# Patient Record
Sex: Male | Born: 1945
Health system: Southern US, Community
[De-identification: ages and names within clinical notes are randomized; demographics above are authoritative.]

## PROBLEM LIST (undated history)

## (undated) DIAGNOSIS — Z860100 Personal history of colon polyps, unspecified: Secondary | ICD-10-CM

## (undated) DIAGNOSIS — H9190 Unspecified hearing loss, unspecified ear: Secondary | ICD-10-CM

## (undated) DIAGNOSIS — Z8679 Personal history of other diseases of the circulatory system: Secondary | ICD-10-CM

## (undated) DIAGNOSIS — K5790 Diverticulosis of intestine, part unspecified, without perforation or abscess without bleeding: Secondary | ICD-10-CM

## (undated) DIAGNOSIS — G4730 Sleep apnea, unspecified: Secondary | ICD-10-CM

## (undated) DIAGNOSIS — I10 Essential (primary) hypertension: Secondary | ICD-10-CM

## (undated) DIAGNOSIS — T8859XA Other complications of anesthesia, initial encounter: Secondary | ICD-10-CM

## (undated) DIAGNOSIS — T4145XA Adverse effect of unspecified anesthetic, initial encounter: Secondary | ICD-10-CM

## (undated) DIAGNOSIS — R9431 Abnormal electrocardiogram [ECG] [EKG]: Secondary | ICD-10-CM

## (undated) DIAGNOSIS — M1990 Unspecified osteoarthritis, unspecified site: Secondary | ICD-10-CM

## (undated) DIAGNOSIS — M1712 Unilateral primary osteoarthritis, left knee: Secondary | ICD-10-CM

## (undated) DIAGNOSIS — E785 Hyperlipidemia, unspecified: Secondary | ICD-10-CM

## (undated) DIAGNOSIS — C4491 Basal cell carcinoma of skin, unspecified: Secondary | ICD-10-CM

## (undated) HISTORY — DX: Essential (primary) hypertension: I10

## (undated) HISTORY — PX: MOUTH SURGERY: SHX715

## (undated) HISTORY — PX: COLONOSCOPY W/ POLYPECTOMY: SHX1380

## (undated) HISTORY — DX: Unilateral primary osteoarthritis, left knee: M17.12

## (undated) HISTORY — DX: Unspecified hearing loss, unspecified ear: H91.90

## (undated) HISTORY — DX: Hyperlipidemia, unspecified: E78.5

---
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
## 2002-09-28 ENCOUNTER — Encounter: Admission: RE | Admit: 2002-09-28 | Discharge: 2002-12-27 | Payer: Self-pay | Admitting: Family Medicine

## 2006-06-23 ENCOUNTER — Encounter: Admission: RE | Admit: 2006-06-23 | Discharge: 2006-06-23 | Payer: Self-pay | Admitting: Family Medicine

## 2010-06-08 ENCOUNTER — Ambulatory Visit: Payer: Self-pay | Admitting: Internal Medicine

## 2013-11-04 HISTORY — PX: ROTATOR CUFF REPAIR: SHX139

## 2013-12-23 ENCOUNTER — Encounter (INDEPENDENT_AMBULATORY_CARE_PROVIDER_SITE_OTHER): Payer: Self-pay | Admitting: Surgery

## 2013-12-31 ENCOUNTER — Ambulatory Visit (INDEPENDENT_AMBULATORY_CARE_PROVIDER_SITE_OTHER): Payer: BC Managed Care – PPO | Admitting: Surgery

## 2014-01-05 ENCOUNTER — Encounter (INDEPENDENT_AMBULATORY_CARE_PROVIDER_SITE_OTHER): Payer: Self-pay | Admitting: Surgery

## 2014-01-10 ENCOUNTER — Encounter (HOSPITAL_COMMUNITY): Payer: Self-pay | Admitting: Pharmacy Technician

## 2014-01-12 ENCOUNTER — Encounter: Payer: Self-pay | Admitting: Physician Assistant

## 2014-01-12 ENCOUNTER — Other Ambulatory Visit: Payer: Self-pay | Admitting: Physician Assistant

## 2014-01-12 DIAGNOSIS — E785 Hyperlipidemia, unspecified: Secondary | ICD-10-CM | POA: Insufficient documentation

## 2014-01-12 DIAGNOSIS — M1712 Unilateral primary osteoarthritis, left knee: Secondary | ICD-10-CM

## 2014-01-12 DIAGNOSIS — H9190 Unspecified hearing loss, unspecified ear: Secondary | ICD-10-CM

## 2014-01-12 DIAGNOSIS — I10 Essential (primary) hypertension: Secondary | ICD-10-CM | POA: Insufficient documentation

## 2014-01-12 NOTE — H&P (Signed)
TOTAL KNEE ADMISSION H&P  Patient is being admitted for left total knee arthroplasty.  Subjective:  Chief Complaint:left knee pain.  HPI: Steven Austin, 68 y.o. male, has a history of pain and functional disability in the left knee due to arthritis and has failed non-surgical conservative treatments for greater than 12 weeks to includeNSAID's and/or analgesics, corticosteriod injections, viscosupplementation injections, flexibility and strengthening excercises, supervised PT with diminished ADL's post treatment, use of assistive devices and activity modification.  Onset of symptoms was gradual, starting 10 years ago with gradually worsening course since that time. The patient noted no past surgery on the left knee(s).  Patient currently rates pain in the left knee(s) at 10 out of 10 with activity. Patient has night pain, worsening of pain with activity and weight bearing, pain that interferes with activities of daily living, crepitus and joint swelling.  Patient has evidence of subchondral sclerosis, periarticular osteophytes and joint space narrowing by imaging studies. There is no active infection.  Patient Active Problem List   Diagnosis Date Noted  . Hypertension   . Hearing loss   . Hyperlipidemia   . Left knee DJD    Past Medical History  Diagnosis Date  . Hypertension   . Hearing loss   . Hyperlipidemia   . Left knee DJD     Past Surgical History  Procedure Laterality Date  . Colon surgery    . Leg surgery    . Colonoscopy       (Not in a hospital admission) Allergies  Allergen Reactions  . Augmentin [Amoxicillin-Pot Clavulanate] Rash     Current Outpatient Prescriptions on File Prior to Visit  Medication Sig Dispense Refill  . aspirin 81 MG tablet Take 81 mg by mouth daily.      Marland Kitchen atorvastatin (LIPITOR) 20 MG tablet Take 20 mg by mouth daily.      . beta carotene w/minerals (OCUVITE) tablet Take 1 tablet by mouth daily.      . fenofibrate 160 MG tablet Take 160 mg  by mouth daily.      Marland Kitchen glucosamine-chondroitin 500-400 MG tablet Take 1 tablet by mouth 3 (three) times daily.      . hydrochlorothiazide (HYDRODIURIL) 25 MG tablet Take 25 mg by mouth daily.      Marland Kitchen NIFEdipine (PROCARDIA-XL/ADALAT CC) 60 MG 24 hr tablet Take 60 mg by mouth daily.       No current facility-administered medications on file prior to visit.   History  Substance Use Topics  . Smoking status: Former Smoker    Quit date: 01/13/1972  . Smokeless tobacco: Not on file  . Alcohol Use: Yes     Comment: 2-3 drinks a day    Family History  Problem Relation Age of Onset  . Cancer Mother   . Lung disease Father   . Breast cancer Sister      Review of Systems  Constitutional: Negative.   HENT: Negative.   Eyes: Negative.   Respiratory: Negative.   Cardiovascular: Negative.   Genitourinary: Negative.   Musculoskeletal: Positive for joint pain.  Skin: Negative.   Neurological: Negative.   Endo/Heme/Allergies: Negative.   Psychiatric/Behavioral: Negative.     Objective:  Physical Exam  Constitutional: He is oriented to person, place, and time. He appears well-developed and well-nourished.  HENT:  Head: Normocephalic and atraumatic.  Mouth/Throat: Oropharynx is clear and moist.  Eyes: Conjunctivae and EOM are normal. Pupils are equal, round, and reactive to light.  Neck: Normal range of motion.  Cardiovascular: Normal rate and normal heart sounds.   Occasional extra beat  Respiratory: Effort normal and breath sounds normal.  GI: Soft. Bowel sounds are normal.  Genitourinary:  Not pertinent to current symptomatology therefore not examined.  Musculoskeletal:  Examination of his left knee reveals pain medially and laterally 1+ crepitation 1+ synovitis range of motion from -5 to 125 degrees knee is stable with normal patella tracking. Exam of the right knee reveals 1+ crepitation 1+ synovitis full range of motion knee is stable with normal patella tracking. Vascular exam:  pulses 2+ and symmetric.  Neurological: He is alert and oriented to person, place, and time.  Skin: Skin is warm and dry.  Psychiatric: He has a normal mood and affect. His behavior is normal.    Vital signs in last 24 hours: Last recorded: 03/11 1500   BP: 142/87 Pulse: 80  Temp: 98.1 F (36.7 C)    Height: 5\' 8"  (1.727 m) SpO2: 98  Weight: 89.812 kg (198 lb)     Labs:   Estimated body mass index is 30.11 kg/(m^2) as calculated from the following:   Height as of this encounter: 5\' 8"  (1.727 m).   Weight as of this encounter: 89.812 kg (198 lb).   Imaging Review X-RAYS: X-rays are reviewed from 10/14 that show medial compartment degenerative joint disease with bone on bone and mild tibiofemoral subluxation. Plain radiographs demonstrate severe degenerative joint disease of the left knee(s). The overall alignment issignificant varus. The bone quality appears to be good for age and reported activity level.  Assessment/Plan:  End stage arthritis, left knee Patient Active Problem List   Diagnosis Date Noted  . Hypertension   . Hearing loss   . Hyperlipidemia   . Left knee DJD      The patient history, physical examination, clinical judgment of the provider and imaging studies are consistent with end stage degenerative joint disease of the left knee(s) and total knee arthroplasty is deemed medically necessary. The treatment options including medical management, injection therapy arthroscopy and arthroplasty were discussed at length. The risks and benefits of total knee arthroplasty were presented and reviewed. The risks due to aseptic loosening, infection, stiffness, patella tracking problems, thromboembolic complications and other imponderables were discussed. The patient acknowledged the explanation, agreed to proceed with the plan and consent was signed. Patient is being admitted for inpatient treatment for surgery, pain control, PT, OT, prophylactic antibiotics, VTE  prophylaxis, progressive ambulation and ADL's and discharge planning. The patient is planning to be discharged home with home health services  Brantlee Hinde A. Kaleen Mask Physician Assistant [facility] [phone]  01/12/2014, 3:51 PM

## 2014-01-14 NOTE — Pre-Procedure Instructions (Signed)
Ponciano Shealy  01/14/2014   Your procedure is scheduled on:  Mon, Mar 23 @ 9:00 AM  Report to [facility] at 7:00 AM.  Call this number if you have problems the morning of surgery: [phone]   Remember:   Do not eat food or drink liquids after midnight.   Take these medicines the morning of surgery with A SIP OF WATER: Procardia(Nifedipine)              Stop taking your Aspirin. No Goody's,BC's,Aleve,Ibuprofen,Fish Oil,or any Herbal Medications   Do not wear jewelry  Do not wear lotions, powders, or colognes. You may wear deodorant.  Men may shave face and neck.  Do not bring valuables to the hospital.  [facility] is not responsible                  for any belongings or valuables.               Contacts, dentures or bridgework may not be worn into surgery.  Leave suitcase in the car. After surgery it may be brought to your room.  For patients admitted to the hospital, discharge time is determined by your                treatment team.               Special Instructions:  [place] - Preparing for Surgery  Before surgery, you can play an important role.  Because skin is not sterile, your skin needs to be as free of germs as possible.  You can reduce the number of germs on you skin by washing with CHG (chlorahexidine gluconate) soap before surgery.  CHG is an antiseptic cleaner which kills germs and bonds with the skin to continue killing germs even after washing.  Please DO NOT use if you have an allergy to CHG or antibacterial soaps.  If your skin becomes reddened/irritated stop using the CHG and inform your nurse when you arrive at Short Stay.  Do not shave (including legs and underarms) for at least 48 hours prior to the first CHG shower.  You may shave your face.  Please follow these instructions carefully:   1.  Shower with CHG Soap the night before surgery and the                                morning of Surgery.  2.  If you choose to wash your hair, wash  your hair first as usual with your       normal shampoo.  3.  After you shampoo, rinse your hair and body thoroughly to remove the                      Shampoo.  4.  Use CHG as you would any other liquid soap.  You can apply chg directly       to the skin and wash gently with scrungie or a clean washcloth.  5.  Apply the CHG Soap to your body ONLY FROM THE NECK DOWN.        Do not use on open wounds or open sores.  Avoid contact with your eyes,       ears, mouth and genitals (private parts).  Wash genitals (private parts)       with your normal soap.  6.  Wash thoroughly, paying special attention to  the area where your surgery        will be performed.  7.  Thoroughly rinse your body with warm water from the neck down.  8.  DO NOT shower/wash with your normal soap after using and rinsing off       the CHG Soap.  9.  Pat yourself dry with a clean towel.            10.  Wear clean pajamas.            11.  Place clean sheets on your bed the night of your first shower and do not        sleep with pets.  Day of Surgery  Do not apply any lotions/deoderants the morning of surgery.  Please wear clean clothes to the hospital/surgery center.     Please read over the following fact sheets that you were given: Pain Booklet, Coughing and Deep Breathing, Blood Transfusion Information, MRSA Information and Surgical Site Infection Prevention

## 2014-01-17 ENCOUNTER — Encounter (INDEPENDENT_AMBULATORY_CARE_PROVIDER_SITE_OTHER): Payer: Self-pay

## 2014-01-17 ENCOUNTER — Encounter (HOSPITAL_COMMUNITY): Payer: Self-pay

## 2014-01-17 ENCOUNTER — Encounter (HOSPITAL_COMMUNITY)
Admission: RE | Admit: 2014-01-17 | Discharge: 2014-01-17 | Disposition: A | Discharge status: Home / Self Care | Payer: BC Managed Care – PPO | Source: Ambulatory Visit | Attending: Orthopedic Surgery | Admitting: Orthopedic Surgery

## 2014-01-17 ENCOUNTER — Encounter (HOSPITAL_COMMUNITY)
Admission: RE | Admit: 2014-01-17 | Discharge: 2014-01-17 | Disposition: A | Discharge status: Home / Self Care | Payer: BC Managed Care – PPO | Source: Ambulatory Visit | Attending: Physician Assistant | Admitting: Physician Assistant

## 2014-01-17 ENCOUNTER — Inpatient Hospital Stay (HOSPITAL_COMMUNITY): Admission: RE | Admit: 2014-01-17 | Payer: Self-pay | Source: Ambulatory Visit

## 2014-01-17 DIAGNOSIS — Z01810 Encounter for preprocedural cardiovascular examination: Secondary | ICD-10-CM | POA: Insufficient documentation

## 2014-01-17 DIAGNOSIS — I10 Essential (primary) hypertension: Secondary | ICD-10-CM | POA: Insufficient documentation

## 2014-01-17 DIAGNOSIS — E785 Hyperlipidemia, unspecified: Secondary | ICD-10-CM | POA: Insufficient documentation

## 2014-01-17 DIAGNOSIS — Z01812 Encounter for preprocedural laboratory examination: Secondary | ICD-10-CM | POA: Insufficient documentation

## 2014-01-17 DIAGNOSIS — Z87891 Personal history of nicotine dependence: Secondary | ICD-10-CM | POA: Insufficient documentation

## 2014-01-17 DIAGNOSIS — H918X9 Other specified hearing loss, unspecified ear: Secondary | ICD-10-CM | POA: Insufficient documentation

## 2014-01-17 DIAGNOSIS — M1710 Unilateral primary osteoarthritis, unspecified knee: Secondary | ICD-10-CM | POA: Insufficient documentation

## 2014-01-17 DIAGNOSIS — R7301 Impaired fasting glucose: Secondary | ICD-10-CM | POA: Insufficient documentation

## 2014-01-17 LAB — CBC WITH DIFFERENTIAL/PLATELET
BASOS ABS: 0.1 10*3/uL (ref 0.0–0.1)
BASOS PCT: 1 % (ref 0–1)
EOS ABS: 0.1 10*3/uL (ref 0.0–0.7)
Eosinophils Relative: 1 % (ref 0–5)
HCT: 46.3 % (ref 39.0–52.0)
Hemoglobin: 16.4 g/dL (ref 13.0–17.0)
Lymphocytes Relative: 22 % (ref 12–46)
Lymphs Abs: 1.6 10*3/uL (ref 0.7–4.0)
MCH: 32.1 pg (ref 26.0–34.0)
MCHC: 35.4 g/dL (ref 30.0–36.0)
MCV: 90.6 fL (ref 78.0–100.0)
Monocytes Absolute: 0.7 10*3/uL (ref 0.1–1.0)
Monocytes Relative: 9 % (ref 3–12)
NEUTROS ABS: 4.7 10*3/uL (ref 1.7–7.7)
NEUTROS PCT: 67 % (ref 43–77)
PLATELETS: 224 10*3/uL (ref 150–400)
RBC: 5.11 MIL/uL (ref 4.22–5.81)
RDW: 12.9 % (ref 11.5–15.5)
WBC: 7.1 10*3/uL (ref 4.0–10.5)

## 2014-01-17 LAB — COMPREHENSIVE METABOLIC PANEL
ALBUMIN: 4.5 g/dL (ref 3.5–5.2)
ALT: 23 U/L (ref 0–53)
AST: 21 U/L (ref 0–37)
Alkaline Phosphatase: 76 U/L (ref 39–117)
BUN: 27 mg/dL — AB (ref 6–23)
CHLORIDE: 99 meq/L (ref 96–112)
CO2: 25 mEq/L (ref 19–32)
Calcium: 10.9 mg/dL — ABNORMAL HIGH (ref 8.4–10.5)
Creatinine, Ser: 0.97 mg/dL (ref 0.50–1.35)
GFR calc Af Amer: >90 mL/min (ref >90)
GFR calc non Af Amer: 84 mL/min — ABNORMAL LOW (ref >90)
Glucose, Bld: 143 mg/dL — ABNORMAL HIGH (ref 70–99)
POTASSIUM: 3.6 meq/L — AB (ref 3.7–5.3)
SODIUM: 141 meq/L (ref 137–147)
TOTAL PROTEIN: 7.7 g/dL (ref 6.0–8.3)
Total Bilirubin: 0.8 mg/dL (ref 0.3–1.2)

## 2014-01-17 LAB — PROTIME-INR
INR: 0.93 (ref 0.00–1.49)
PROTHROMBIN TIME: 12.3 s (ref 11.6–15.2)

## 2014-01-17 LAB — URINALYSIS, ROUTINE W REFLEX MICROSCOPIC
BILIRUBIN URINE: NEGATIVE
Glucose, UA: NEGATIVE mg/dL
Hgb urine dipstick: NEGATIVE
KETONES UR: NEGATIVE mg/dL
LEUKOCYTES UA: NEGATIVE
NITRITE: NEGATIVE
Protein, ur: NEGATIVE mg/dL
SPECIFIC GRAVITY, URINE: 1.028 (ref 1.005–1.030)
UROBILINOGEN UA: 0.2 mg/dL (ref 0.0–1.0)
pH: 5.5 (ref 5.0–8.0)

## 2014-01-17 LAB — ABO/RH: ABO/RH(D): O NEG

## 2014-01-17 LAB — TYPE AND SCREEN
ABO/RH(D): O NEG
ANTIBODY SCREEN: NEGATIVE

## 2014-01-17 LAB — SURGICAL PCR SCREEN
MRSA, PCR: NEGATIVE
Staphylococcus aureus: NEGATIVE

## 2014-01-17 LAB — APTT: aPTT: 26 seconds (ref 24–37)

## 2014-01-17 NOTE — Progress Notes (Signed)
01/17/14 0819  OBSTRUCTIVE SLEEP APNEA  Have you ever been diagnosed with sleep apnea through a sleep study? No  Do you snore loudly (loud enough to be heard through closed doors)?  1  Do you often feel tired, fatigued, or sleepy during the daytime? 0  Has anyone observed you stop breathing during your sleep? 0  Do you have, or are you being treated for high blood pressure? 1  BMI more than 35 kg/m2? 0  Age over 68 years old? 1  Neck circumference greater than 40 cm/18 inches? 0  Gender: 1  Obstructive Sleep Apnea Score 4  Score 4 or greater  Results sent to PCP

## 2014-01-18 LAB — URINE CULTURE
Colony Count: NO GROWTH
Culture: NO GROWTH

## 2014-01-18 NOTE — Progress Notes (Signed)
Anesthesia Chart Review:  Patient is a 68 year old male scheduled for left TKR on 01/24/14 by Dr. Noemi Chapel. History includes HTN, former smoker, HLD, high frequency hearing loss, DJD, colon surgery (polyps removed). Impaired fasting glucose by PCP notes. BMI 28.75. PCP is Dr. Hulan Fess, last visit for CPE on 12/22/13.  He is aware of plans for surgery.  EKG on 01/17/14 showed NSr, LAFB, poor r wave progression. There are no comparison EKGs in Epic, Muse, or at Dr. Eddie Dibbles office. He denied prior stress, echo, or cath. No CV symptoms reported at PAT. No known MI or CHF history.  Preoperative CXR and labs noted.  Reviewed with anesthesiologist Dr. Tobias Alexander. If he remains asymptomatic from a CV standpoint then it is anticipated that he can proceed as planned.  George Hugh [facility] Short Stay Center/Anesthesiology Phone [phone] 01/18/2014 5:17 PM

## 2014-01-21 MED ORDER — FENTANYL CITRATE 0.05 MG/ML IJ SOLN
INTRAMUSCULAR | Status: AC
Start: 2014-01-21 — End: 2014-01-21
  Filled 2014-01-21: qty 5

## 2014-01-21 MED ORDER — GLYCOPYRROLATE 0.2 MG/ML IJ SOLN
INTRAMUSCULAR | Status: AC
  Filled 2014-01-21: qty 3

## 2014-01-21 MED ORDER — LIDOCAINE HCL (CARDIAC) 20 MG/ML IV SOLN
INTRAVENOUS | Status: AC
  Filled 2014-01-21: qty 5

## 2014-01-21 MED ORDER — SODIUM CHLORIDE 0.9 % IJ SOLN
INTRAMUSCULAR | Status: AC
  Filled 2014-01-21: qty 10

## 2014-01-21 MED ORDER — FENTANYL CITRATE 0.05 MG/ML IJ SOLN
INTRAMUSCULAR | Status: AC
  Filled 2014-01-21: qty 5

## 2014-01-21 MED ORDER — ROCURONIUM BROMIDE 50 MG/5ML IV SOLN
INTRAVENOUS | Status: AC
  Filled 2014-01-21: qty 1

## 2014-01-21 MED ORDER — PROPOFOL 10 MG/ML IV BOLUS
INTRAVENOUS | Status: AC
  Filled 2014-01-21: qty 20

## 2014-01-21 MED ORDER — PHENYLEPHRINE 40 MCG/ML (10ML) SYRINGE FOR IV PUSH (FOR BLOOD PRESSURE SUPPORT)
PREFILLED_SYRINGE | INTRAVENOUS | Status: AC
  Filled 2014-01-21: qty 10

## 2014-01-21 MED ORDER — EPHEDRINE SULFATE 50 MG/ML IJ SOLN
INTRAMUSCULAR | Status: AC
  Filled 2014-01-21: qty 1

## 2014-01-21 MED ORDER — MIDAZOLAM HCL 2 MG/2ML IJ SOLN
INTRAMUSCULAR | Status: AC
  Filled 2014-01-21: qty 2

## 2014-01-21 MED ORDER — ONDANSETRON HCL 4 MG/2ML IJ SOLN
INTRAMUSCULAR | Status: AC
  Filled 2014-01-21: qty 2

## 2014-01-21 MED ORDER — NEOSTIGMINE METHYLSULFATE 1 MG/ML IJ SOLN
INTRAMUSCULAR | Status: AC
  Filled 2014-01-21: qty 10

## 2014-01-23 MED ORDER — CLINDAMYCIN PHOSPHATE 900 MG/50ML IV SOLN
900.0000 mg | INTRAVENOUS | Status: AC
  Administered 2014-01-24: 900 mg via INTRAVENOUS
  Filled 2014-01-23: qty 50

## 2014-01-24 ENCOUNTER — Ambulatory Visit (HOSPITAL_COMMUNITY): Payer: BC Managed Care – PPO | Admitting: Anesthesiology

## 2014-01-24 ENCOUNTER — Encounter (HOSPITAL_COMMUNITY): Payer: BC Managed Care – PPO

## 2014-01-24 ENCOUNTER — Encounter (HOSPITAL_COMMUNITY)
Admission: RE | Disposition: A | Discharge status: Home Health | Payer: Self-pay | Source: Ambulatory Visit | Attending: Orthopedic Surgery

## 2014-01-24 ENCOUNTER — Inpatient Hospital Stay (HOSPITAL_COMMUNITY)
Admission: RE | Admit: 2014-01-24 | Discharge: 2014-01-26 | DRG: 470 | Disposition: A | Discharge status: Home Health | Payer: BC Managed Care – PPO | Source: Ambulatory Visit | Attending: Orthopedic Surgery | Admitting: Orthopedic Surgery

## 2014-01-24 ENCOUNTER — Encounter (HOSPITAL_COMMUNITY): Payer: Self-pay | Admitting: Surgery

## 2014-01-24 DIAGNOSIS — R7309 Other abnormal glucose: Secondary | ICD-10-CM | POA: Diagnosis present

## 2014-01-24 DIAGNOSIS — E785 Hyperlipidemia, unspecified: Secondary | ICD-10-CM | POA: Diagnosis present

## 2014-01-24 DIAGNOSIS — M1712 Unilateral primary osteoarthritis, left knee: Secondary | ICD-10-CM

## 2014-01-24 DIAGNOSIS — Z79899 Other long term (current) drug therapy: Secondary | ICD-10-CM

## 2014-01-24 DIAGNOSIS — Z87891 Personal history of nicotine dependence: Secondary | ICD-10-CM

## 2014-01-24 DIAGNOSIS — I10 Essential (primary) hypertension: Secondary | ICD-10-CM | POA: Diagnosis present

## 2014-01-24 DIAGNOSIS — H9190 Unspecified hearing loss, unspecified ear: Secondary | ICD-10-CM | POA: Diagnosis present

## 2014-01-24 DIAGNOSIS — M179 Osteoarthritis of knee, unspecified: Secondary | ICD-10-CM | POA: Diagnosis present

## 2014-01-24 DIAGNOSIS — R739 Hyperglycemia, unspecified: Secondary | ICD-10-CM | POA: Diagnosis present

## 2014-01-24 DIAGNOSIS — Z7982 Long term (current) use of aspirin: Secondary | ICD-10-CM

## 2014-01-24 DIAGNOSIS — M1710 Unilateral primary osteoarthritis, unspecified knee: Principal | ICD-10-CM | POA: Diagnosis present

## 2014-01-24 HISTORY — DX: Adverse effect of unspecified anesthetic, initial encounter: T41.45XA

## 2014-01-24 HISTORY — PX: TOTAL KNEE ARTHROPLASTY: SHX125

## 2014-01-24 HISTORY — DX: Other complications of anesthesia, initial encounter: T88.59XA

## 2014-01-24 HISTORY — DX: Unspecified osteoarthritis, unspecified site: M19.90

## 2014-01-24 SURGERY — ARTHROPLASTY, KNEE, TOTAL
Anesthesia: General | Site: Knee | Laterality: Left

## 2014-01-24 MED ORDER — GLYCOPYRROLATE 0.2 MG/ML IJ SOLN
INTRAMUSCULAR | Status: DC | PRN
  Administered 2014-01-24: 0.4 mg via INTRAVENOUS

## 2014-01-24 MED ORDER — MENTHOL 3 MG MT LOZG: 1.0000 | LOZENGE | OROMUCOSAL | Status: DC | PRN

## 2014-01-24 MED ORDER — LORAZEPAM 0.5 MG PO TABS: 0.5000 mg | ORAL_TABLET | Freq: Four times a day (QID) | ORAL | Status: DC | PRN

## 2014-01-24 MED ORDER — FENTANYL CITRATE 0.05 MG/ML IJ SOLN
INTRAMUSCULAR | Status: AC
  Filled 2014-01-24: qty 5

## 2014-01-24 MED ORDER — BUPIVACAINE-EPINEPHRINE 0.25% -1:200000 IJ SOLN
INTRAMUSCULAR | Status: DC | PRN
  Administered 2014-01-24: 30 mL

## 2014-01-24 MED ORDER — LIDOCAINE HCL 4 % MT SOLN
OROMUCOSAL | Status: DC | PRN
  Administered 2014-01-24: 2 mL via TOPICAL

## 2014-01-24 MED ORDER — OXYCODONE HCL 5 MG PO TABS
5.0000 mg | ORAL_TABLET | ORAL | Status: DC | PRN
  Administered 2014-01-24: 5 mg via ORAL
  Administered 2014-01-24 – 2014-01-26 (×9): 10 mg via ORAL
  Filled 2014-01-24 (×2): qty 2
  Filled 2014-01-24: qty 1
  Filled 2014-01-24 (×7): qty 2

## 2014-01-24 MED ORDER — LIDOCAINE HCL (CARDIAC) 20 MG/ML IV SOLN
INTRAVENOUS | Status: AC
  Filled 2014-01-24: qty 10

## 2014-01-24 MED ORDER — DOCUSATE SODIUM 100 MG PO CAPS
100.0000 mg | ORAL_CAPSULE | Freq: Two times a day (BID) | ORAL | Status: DC
  Administered 2014-01-24 – 2014-01-26 (×5): 100 mg via ORAL
  Filled 2014-01-24 (×6): qty 1

## 2014-01-24 MED ORDER — POVIDONE-IODINE 7.5 % EX SOLN: Freq: Once | CUTANEOUS | Status: DC

## 2014-01-24 MED ORDER — FENTANYL CITRATE 0.05 MG/ML IJ SOLN
INTRAMUSCULAR | Status: AC
  Filled 2014-01-24: qty 2

## 2014-01-24 MED ORDER — DEXAMETHASONE SODIUM PHOSPHATE 10 MG/ML IJ SOLN
INTRAMUSCULAR | Status: AC
  Filled 2014-01-24: qty 1

## 2014-01-24 MED ORDER — OCUVITE PO TABS
1.0000 | ORAL_TABLET | Freq: Every day | ORAL | Status: DC
  Administered 2014-01-25 – 2014-01-26 (×2): 1 via ORAL
  Filled 2014-01-24 (×4): qty 1

## 2014-01-24 MED ORDER — POTASSIUM CHLORIDE IN NACL 20-0.9 MEQ/L-% IV SOLN
INTRAVENOUS | Status: DC
  Administered 2014-01-24 – 2014-01-25 (×2): via INTRAVENOUS
  Filled 2014-01-24 (×6): qty 1000

## 2014-01-24 MED ORDER — METOCLOPRAMIDE HCL 5 MG/ML IJ SOLN: 5.0000 mg | Freq: Three times a day (TID) | INTRAMUSCULAR | Status: DC | PRN

## 2014-01-24 MED ORDER — ACETAMINOPHEN 650 MG RE SUPP: 650.0000 mg | Freq: Four times a day (QID) | RECTAL | Status: DC | PRN

## 2014-01-24 MED ORDER — EPHEDRINE SULFATE 50 MG/ML IJ SOLN
INTRAMUSCULAR | Status: AC
  Filled 2014-01-24: qty 1

## 2014-01-24 MED ORDER — ACETAMINOPHEN 325 MG PO TABS
650.0000 mg | ORAL_TABLET | Freq: Four times a day (QID) | ORAL | Status: DC | PRN
  Filled 2014-01-24: qty 2

## 2014-01-24 MED ORDER — DEXAMETHASONE 6 MG PO TABS
10.0000 mg | ORAL_TABLET | Freq: Three times a day (TID) | ORAL | Status: AC
  Filled 2014-01-24 (×3): qty 1

## 2014-01-24 MED ORDER — NEOSTIGMINE METHYLSULFATE 1 MG/ML IJ SOLN
INTRAMUSCULAR | Status: DC | PRN
  Administered 2014-01-24: 3 mg via INTRAVENOUS

## 2014-01-24 MED ORDER — LACTATED RINGERS IV SOLN
INTRAVENOUS | Status: DC | PRN
  Administered 2014-01-24 (×2): via INTRAVENOUS

## 2014-01-24 MED ORDER — SODIUM CHLORIDE 0.9 % IR SOLN
Status: DC | PRN
  Administered 2014-01-24 (×3): 1000 mL

## 2014-01-24 MED ORDER — ONDANSETRON HCL 4 MG/2ML IJ SOLN
INTRAMUSCULAR | Status: AC
  Filled 2014-01-24: qty 2

## 2014-01-24 MED ORDER — LACTATED RINGERS IV SOLN: INTRAVENOUS | Status: DC

## 2014-01-24 MED ORDER — PROPOFOL 10 MG/ML IV BOLUS
INTRAVENOUS | Status: AC
  Filled 2014-01-24: qty 20

## 2014-01-24 MED ORDER — HYDROMORPHONE HCL PF 1 MG/ML IJ SOLN
INTRAMUSCULAR | Status: AC
  Filled 2014-01-24: qty 1

## 2014-01-24 MED ORDER — STERILE WATER FOR INJECTION IJ SOLN
INTRAMUSCULAR | Status: AC
  Filled 2014-01-24: qty 10

## 2014-01-24 MED ORDER — HYDROMORPHONE HCL PF 1 MG/ML IJ SOLN
0.2500 mg | INTRAMUSCULAR | Status: DC | PRN
  Administered 2014-01-24 (×2): 0.5 mg via INTRAVENOUS

## 2014-01-24 MED ORDER — ASPIRIN EC 325 MG PO TBEC
325.0000 mg | DELAYED_RELEASE_TABLET | Freq: Every day | ORAL | Status: DC
  Administered 2014-01-25 – 2014-01-26 (×2): 325 mg via ORAL
  Filled 2014-01-24 (×3): qty 1

## 2014-01-24 MED ORDER — FENTANYL CITRATE 0.05 MG/ML IJ SOLN
INTRAMUSCULAR | Status: DC | PRN
  Administered 2014-01-24 (×2): 50 ug via INTRAVENOUS
  Administered 2014-01-24: 150 ug via INTRAVENOUS
  Administered 2014-01-24: 100 ug via INTRAVENOUS

## 2014-01-24 MED ORDER — OXYCODONE HCL 5 MG PO TABS
ORAL_TABLET | ORAL | Status: AC
  Filled 2014-01-24: qty 1

## 2014-01-24 MED ORDER — ALUM & MAG HYDROXIDE-SIMETH 200-200-20 MG/5ML PO SUSP: 30.0000 mL | ORAL | Status: DC | PRN

## 2014-01-24 MED ORDER — DEXAMETHASONE SODIUM PHOSPHATE 10 MG/ML IJ SOLN
INTRAMUSCULAR | Status: DC | PRN
  Administered 2014-01-24: 10 mg via INTRAVENOUS

## 2014-01-24 MED ORDER — MIDAZOLAM HCL 5 MG/5ML IJ SOLN
INTRAMUSCULAR | Status: DC | PRN
  Administered 2014-01-24: 2 mg via INTRAVENOUS

## 2014-01-24 MED ORDER — NIFEDIPINE ER 60 MG PO TB24
60.0000 mg | ORAL_TABLET | Freq: Every day | ORAL | Status: DC
  Administered 2014-01-25 – 2014-01-26 (×2): 60 mg via ORAL
  Filled 2014-01-24 (×2): qty 1

## 2014-01-24 MED ORDER — ONDANSETRON HCL 4 MG/2ML IJ SOLN
INTRAMUSCULAR | Status: DC | PRN
  Administered 2014-01-24: 4 mg via INTRAVENOUS

## 2014-01-24 MED ORDER — CELECOXIB 200 MG PO CAPS
200.0000 mg | ORAL_CAPSULE | Freq: Two times a day (BID) | ORAL | Status: DC
  Administered 2014-01-24 – 2014-01-26 (×5): 200 mg via ORAL
  Filled 2014-01-24 (×6): qty 1

## 2014-01-24 MED ORDER — MIDAZOLAM HCL 2 MG/2ML IJ SOLN
INTRAMUSCULAR | Status: AC
  Filled 2014-01-24: qty 2

## 2014-01-24 MED ORDER — CLINDAMYCIN PHOSPHATE 600 MG/50ML IV SOLN
600.0000 mg | Freq: Four times a day (QID) | INTRAVENOUS | Status: AC
  Administered 2014-01-24 (×2): 600 mg via INTRAVENOUS
  Filled 2014-01-24 (×2): qty 50

## 2014-01-24 MED ORDER — PHENOL 1.4 % MT LIQD: 1.0000 | OROMUCOSAL | Status: DC | PRN

## 2014-01-24 MED ORDER — BISACODYL 5 MG PO TBEC
10.0000 mg | DELAYED_RELEASE_TABLET | Freq: Every day | ORAL | Status: DC
  Administered 2014-01-24 – 2014-01-25 (×2): 10 mg via ORAL
  Filled 2014-01-24 (×2): qty 2

## 2014-01-24 MED ORDER — HYDROMORPHONE HCL PF 1 MG/ML IJ SOLN
1.0000 mg | INTRAMUSCULAR | Status: DC | PRN
  Administered 2014-01-24: 1 mg via INTRAVENOUS
  Filled 2014-01-24: qty 1

## 2014-01-24 MED ORDER — ONDANSETRON HCL 4 MG PO TABS: 4.0000 mg | ORAL_TABLET | Freq: Four times a day (QID) | ORAL | Status: DC | PRN

## 2014-01-24 MED ORDER — ATORVASTATIN CALCIUM 20 MG PO TABS
20.0000 mg | ORAL_TABLET | Freq: Every day | ORAL | Status: DC
  Administered 2014-01-24 – 2014-01-26 (×3): 20 mg via ORAL
  Filled 2014-01-24 (×4): qty 1

## 2014-01-24 MED ORDER — FENOFIBRATE 160 MG PO TABS
160.0000 mg | ORAL_TABLET | Freq: Every day | ORAL | Status: DC
  Administered 2014-01-24 – 2014-01-26 (×3): 160 mg via ORAL
  Filled 2014-01-24 (×3): qty 1

## 2014-01-24 MED ORDER — DIPHENHYDRAMINE HCL 12.5 MG/5ML PO ELIX: 12.5000 mg | ORAL_SOLUTION | ORAL | Status: DC | PRN

## 2014-01-24 MED ORDER — CHLORHEXIDINE GLUCONATE 4 % EX LIQD: 60.0000 mL | Freq: Once | CUTANEOUS | Status: DC

## 2014-01-24 MED ORDER — ARTIFICIAL TEARS OP OINT
TOPICAL_OINTMENT | OPHTHALMIC | Status: AC
  Filled 2014-01-24: qty 3.5

## 2014-01-24 MED ORDER — GLYCOPYRROLATE 0.2 MG/ML IJ SOLN
INTRAMUSCULAR | Status: AC
  Filled 2014-01-24: qty 3

## 2014-01-24 MED ORDER — ONDANSETRON HCL 4 MG/2ML IJ SOLN: 4.0000 mg | Freq: Four times a day (QID) | INTRAMUSCULAR | Status: DC | PRN

## 2014-01-24 MED ORDER — BUPIVACAINE-EPINEPHRINE (PF) 0.25% -1:200000 IJ SOLN
INTRAMUSCULAR | Status: AC
  Filled 2014-01-24: qty 30

## 2014-01-24 MED ORDER — OXYCODONE HCL 5 MG/5ML PO SOLN: 5.0000 mg | Freq: Once | ORAL | Status: AC | PRN

## 2014-01-24 MED ORDER — LACTATED RINGERS IV SOLN
INTRAVENOUS | Status: DC
  Administered 2014-01-24: 07:00:00 via INTRAVENOUS

## 2014-01-24 MED ORDER — NEOSTIGMINE METHYLSULFATE 1 MG/ML IJ SOLN
INTRAMUSCULAR | Status: AC
  Filled 2014-01-24: qty 10

## 2014-01-24 MED ORDER — ROCURONIUM BROMIDE 100 MG/10ML IV SOLN
INTRAVENOUS | Status: DC | PRN
  Administered 2014-01-24: 50 mg via INTRAVENOUS

## 2014-01-24 MED ORDER — PROMETHAZINE HCL 25 MG/ML IJ SOLN: 6.2500 mg | INTRAMUSCULAR | Status: DC | PRN

## 2014-01-24 MED ORDER — ARTIFICIAL TEARS OP OINT
TOPICAL_OINTMENT | OPHTHALMIC | Status: DC | PRN
  Administered 2014-01-24: 1 via OPHTHALMIC

## 2014-01-24 MED ORDER — ROCURONIUM BROMIDE 50 MG/5ML IV SOLN
INTRAVENOUS | Status: AC
  Filled 2014-01-24: qty 1

## 2014-01-24 MED ORDER — METOCLOPRAMIDE HCL 10 MG PO TABS: 5.0000 mg | ORAL_TABLET | Freq: Three times a day (TID) | ORAL | Status: DC | PRN

## 2014-01-24 MED ORDER — OXYCODONE HCL 5 MG PO TABS
5.0000 mg | ORAL_TABLET | Freq: Once | ORAL | Status: AC | PRN
  Administered 2014-01-24: 5 mg via ORAL

## 2014-01-24 MED ORDER — PROPOFOL 10 MG/ML IV BOLUS
INTRAVENOUS | Status: DC | PRN
  Administered 2014-01-24: 200 mg via INTRAVENOUS

## 2014-01-24 MED ORDER — DEXAMETHASONE SODIUM PHOSPHATE 10 MG/ML IJ SOLN
10.0000 mg | Freq: Three times a day (TID) | INTRAMUSCULAR | Status: AC
  Administered 2014-01-24 – 2014-01-25 (×3): 10 mg via INTRAVENOUS
  Filled 2014-01-24 (×3): qty 1

## 2014-01-24 SURGICAL SUPPLY — 75 items
BANDAGE ESMARK 6X9 LF (GAUZE/BANDAGES/DRESSINGS) ×1 IMPLANT
BLADE SAGITTAL 25.0X1.19X90 (BLADE) ×2 IMPLANT
BLADE SAGITTAL 25.0X1.19X90MM (BLADE) ×1
BLADE SAW SGTL 11.0X1.19X90.0M (BLADE) IMPLANT
BLADE SAW SGTL 13.0X1.19X90.0M (BLADE) ×3 IMPLANT
BLADE SURG 10 STRL SS (BLADE) ×6 IMPLANT
BNDG ELASTIC 6X10 VLCR STRL LF (GAUZE/BANDAGES/DRESSINGS) ×3 IMPLANT
BNDG ELASTIC 6X15 VLCR STRL LF (GAUZE/BANDAGES/DRESSINGS) ×3 IMPLANT
BNDG ESMARK 6X9 LF (GAUZE/BANDAGES/DRESSINGS) ×3
BOWL SMART MIX CTS (DISPOSABLE) ×3 IMPLANT
CAPT RP KNEE ×3 IMPLANT
CEMENT HV SMART SET (Cement) ×6 IMPLANT
CLOSURE WOUND 1/2 X4 (GAUZE/BANDAGES/DRESSINGS) ×1
COVER SURGICAL LIGHT HANDLE (MISCELLANEOUS) ×6 IMPLANT
CUFF TOURNIQUET SINGLE 34IN LL (TOURNIQUET CUFF) ×3 IMPLANT
CUFF TOURNIQUET SINGLE 44IN (TOURNIQUET CUFF) IMPLANT
DRAPE EXTREMITY T 121X128X90 (DRAPE) ×3 IMPLANT
DRAPE INCISE IOBAN 66X45 STRL (DRAPES) ×3 IMPLANT
DRAPE PROXIMA HALF (DRAPES) ×9 IMPLANT
DRAPE U-SHAPE 47X51 STRL (DRAPES) ×3 IMPLANT
DRSG ADAPTIC 3X8 NADH LF (GAUZE/BANDAGES/DRESSINGS) ×3 IMPLANT
DRSG PAD ABDOMINAL 8X10 ST (GAUZE/BANDAGES/DRESSINGS) ×6 IMPLANT
DURAPREP 26ML APPLICATOR (WOUND CARE) ×6 IMPLANT
ELECT CAUTERY BLADE 6.4 (BLADE) ×3 IMPLANT
ELECT REM PT RETURN 9FT ADLT (ELECTROSURGICAL) ×3
ELECTRODE REM PT RTRN 9FT ADLT (ELECTROSURGICAL) ×1 IMPLANT
EVACUATOR 1/8 PVC DRAIN (DRAIN) ×3 IMPLANT
FACESHIELD LNG OPTICON STERILE (SAFETY) ×3 IMPLANT
GLOVE BIO SURGEON STRL SZ7 (GLOVE) ×9 IMPLANT
GLOVE BIOGEL PI IND STRL 6.5 (GLOVE) ×1 IMPLANT
GLOVE BIOGEL PI IND STRL 7.0 (GLOVE) ×2 IMPLANT
GLOVE BIOGEL PI IND STRL 7.5 (GLOVE) ×1 IMPLANT
GLOVE BIOGEL PI IND STRL 8 (GLOVE) ×1 IMPLANT
GLOVE BIOGEL PI INDICATOR 6.5 (GLOVE) ×2
GLOVE BIOGEL PI INDICATOR 7.0 (GLOVE) ×4
GLOVE BIOGEL PI INDICATOR 7.5 (GLOVE) ×2
GLOVE BIOGEL PI INDICATOR 8 (GLOVE) ×2
GLOVE SS BIOGEL STRL SZ 7.5 (GLOVE) ×1 IMPLANT
GLOVE SUPERSENSE BIOGEL SZ 7.5 (GLOVE) ×2
GLOVE SURG SS PI 8.0 STRL IVOR (GLOVE) ×3 IMPLANT
GOWN STRL REUS W/ TWL LRG LVL3 (GOWN DISPOSABLE) IMPLANT
GOWN STRL REUS W/ TWL XL LVL3 (GOWN DISPOSABLE) ×3 IMPLANT
GOWN STRL REUS W/TWL 2XL LVL3 (GOWN DISPOSABLE) ×3 IMPLANT
GOWN STRL REUS W/TWL LRG LVL3 (GOWN DISPOSABLE)
GOWN STRL REUS W/TWL XL LVL3 (GOWN DISPOSABLE) ×6
HANDPIECE INTERPULSE COAX TIP (DISPOSABLE) ×2
HOOD PEEL AWAY FACE SHEILD DIS (HOOD) ×9 IMPLANT
IMMOBILIZER KNEE 22 UNIV (SOFTGOODS) IMPLANT
KIT BASIN OR (CUSTOM PROCEDURE TRAY) ×3 IMPLANT
KIT ROOM TURNOVER OR (KITS) ×3 IMPLANT
MANIFOLD NEPTUNE II (INSTRUMENTS) ×3 IMPLANT
NS IRRIG 1000ML POUR BTL (IV SOLUTION) ×3 IMPLANT
PACK TOTAL JOINT (CUSTOM PROCEDURE TRAY) ×3 IMPLANT
PAD ABD 8X10 STRL (GAUZE/BANDAGES/DRESSINGS) ×3 IMPLANT
PAD ARMBOARD 7.5X6 YLW CONV (MISCELLANEOUS) ×3 IMPLANT
PAD CAST 4YDX4 CTTN HI CHSV (CAST SUPPLIES) ×1 IMPLANT
PADDING CAST COTTON 4X4 STRL (CAST SUPPLIES) ×2
PADDING CAST COTTON 6X4 STRL (CAST SUPPLIES) ×3 IMPLANT
RUBBERBAND STERILE (MISCELLANEOUS) ×3 IMPLANT
SET HNDPC FAN SPRY TIP SCT (DISPOSABLE) ×1 IMPLANT
SPONGE GAUZE 4X4 12PLY (GAUZE/BANDAGES/DRESSINGS) ×3 IMPLANT
SPONGE GAUZE 4X4 12PLY STER LF (GAUZE/BANDAGES/DRESSINGS) ×3 IMPLANT
STRIP CLOSURE SKIN 1/2X4 (GAUZE/BANDAGES/DRESSINGS) ×2 IMPLANT
SUCTION FRAZIER TIP 10 FR DISP (SUCTIONS) ×3 IMPLANT
SUT ETHIBOND NAB CT1 #1 30IN (SUTURE) ×3 IMPLANT
SUT MNCRL AB 3-0 PS2 18 (SUTURE) ×3 IMPLANT
SUT VIC AB 0 CT1 27 (SUTURE) ×6
SUT VIC AB 0 CT1 27XBRD ANBCTR (SUTURE) ×3 IMPLANT
SUT VIC AB 2-0 CT1 27 (SUTURE) ×2
SUT VIC AB 2-0 CT1 TAPERPNT 27 (SUTURE) ×1 IMPLANT
SYR 30ML SLIP (SYRINGE) ×3 IMPLANT
TOWEL OR 17X24 6PK STRL BLUE (TOWEL DISPOSABLE) ×3 IMPLANT
TOWEL OR 17X26 10 PK STRL BLUE (TOWEL DISPOSABLE) ×3 IMPLANT
TRAY FOLEY CATH 16FR SILVER (SET/KITS/TRAYS/PACK) ×3 IMPLANT
WATER STERILE IRR 1000ML POUR (IV SOLUTION) ×3 IMPLANT

## 2014-01-24 NOTE — Progress Notes (Signed)
Orthopedic Tech Progress Note Patient Details:  Steven Austin 12-03-1945 [id number] CPM applied to Left LE with appropriate settings. OHF applied to bed. Footsie roll provided.  CPM Left Knee CPM Left Knee: On Left Knee Flexion (Degrees): 60 Left Knee Extension (Degrees): 0   Asia R Thompson 01/24/2014, 11:49 AM

## 2014-01-24 NOTE — Transfer of Care (Signed)
Immediate Anesthesia Transfer of Care Note  Patient: Steven Austin  Procedure(s) Performed: Procedure(s): LEFT TOTAL KNEE ARTHROPLASTY (Left)  Patient Location: PACU  Anesthesia Type:General  Level of Consciousness: awake, alert , oriented and patient cooperative  Airway & Oxygen Therapy: Patient Spontanous Breathing  Post-op Assessment: Report given to PACU RN and Post -op Vital signs reviewed and stable  Post vital signs: Reviewed and stable  Complications: No apparent anesthesia complications

## 2014-01-24 NOTE — Progress Notes (Signed)
Utilization review completed.  

## 2014-01-24 NOTE — Progress Notes (Signed)
Orthopedic Tech Progress Note Patient Details:  Steven Austin 08/14/1946 [id number]  Patient ID: Steven Austin, male   DOB: June 20, 1946, 68 y.o.   MRN: [id number] Placed pt's lle in cpm @ 0-60 degrees @1445   Hildred Priest 01/24/2014, 2:43 PM

## 2014-01-24 NOTE — Progress Notes (Signed)
Orthopedic Tech Progress Note Patient Details:  Steven Austin 12/23/1945 [id number]  Patient ID: Steven Austin, male   DOB: 04-Apr-1946, 68 y.o.   MRN: [id number] Placed pt's lle in cpm @ 0- 60 degrees @ 2025  Hildred Priest 01/24/2014, 8:26 PM

## 2014-01-24 NOTE — Evaluation (Signed)
Physical Therapy Evaluation Patient Details Name: Steven Austin MRN: [id number] DOB: 09-Feb-1946 Today's Date: 01/24/2014 Time: 8546-2703 PT Time Calculation (min): 33 min  PT Assessment / Plan / Recommendation History of Present Illness  Patient is a 68 yo male s/p Lt TKA.  Clinical Impression  Patient presents with problems listed below.  Will benefit from acute PT to maximize independence prior to discharge home with wife.    PT Assessment  Patient needs continued PT services    Follow Up Recommendations  Home health PT;Supervision/Assistance - 24 hour    Does the patient have the potential to tolerate intense rehabilitation      Barriers to Discharge        Equipment Recommendations  None recommended by PT    Recommendations for Other Services     Frequency 7X/week    Precautions / Restrictions Precautions Precautions: Knee Precaution Booklet Issued: Yes (comment) Precaution Comments: Reviewed precautions with patient. Required Braces or Orthoses: Knee Immobilizer - Left Knee Immobilizer - Left: On when out of bed or walking;Discontinue once straight leg raise with < 10 degree lag Restrictions Weight Bearing Restrictions: Yes LLE Weight Bearing: Weight bearing as tolerated   Pertinent Vitals/Pain       Mobility  Bed Mobility Overal bed mobility: Needs Assistance Bed Mobility: Supine to Sit Supine to sit: Min assist General bed mobility comments: Instructed patient on donning KI on LLE.  Verbal cues for bed mobility.  Assist to move LLE off of bed.  Good sitting balance. Transfers Overall transfer level: Needs assistance Equipment used: Rolling walker (2 wheeled) Transfers: Sit to/from Stand Sit to Stand: Min assist General transfer comment: Verbal cues for hand placement and technique.  Assist to rise to standing and for balance initially. Ambulation/Gait Ambulation/Gait assistance: Min assist Ambulation Distance (Feet): 15 Feet Assistive device:  Rolling walker (2 wheeled) Gait Pattern/deviations: Step-to pattern;Decreased stance time - left;Decreased step length - right;Decreased weight shift to right;Antalgic Gait velocity: Decreased Gait velocity interpretation: Below normal speed for age/gender General Gait Details: Verbal cues for safe use of RW and gait sequence.      Exercises Total Joint Exercises Ankle Circles/Pumps: AROM;Both;10 reps;Seated   PT Diagnosis: Difficulty walking;Acute pain  PT Problem List: Decreased strength;Decreased range of motion;Decreased activity tolerance;Decreased balance;Decreased mobility;Decreased knowledge of use of DME;Decreased knowledge of precautions;Pain PT Treatment Interventions: DME instruction;Gait training;Functional mobility training;Therapeutic exercise;Patient/family education     PT Goals(Current goals can be found in the care plan section) Acute Rehab PT Goals Patient Stated Goal: To go home soon PT Goal Formulation: With patient Time For Goal Achievement: 01/31/14 Potential to Achieve Goals: Good  Visit Information  Last PT Received On: 01/24/14 Assistance Needed: +1 History of Present Illness: Patient is a 68 yo male s/p Lt TKA.       Prior [place] expects to be discharged to:: Private residence Living Arrangements: Spouse/significant other Available Help at Discharge: Family;Available 24 hours/day Type of Home: House Home Access: Level entry Home Layout: Multi-level;Able to live on main level with bedroom/bathroom Home Equipment: Gilford Rile - 2 wheels;Bedside commode;Tub bench;Cane - single point Prior Function Level of Independence: Independent Communication Communication: Development worker, international aid Arousal/Alertness: Awake/alert Behavior During Therapy: WFL for tasks assessed/performed Overall Cognitive Status: Within Functional Limits for tasks assessed    Extremity/Trunk Assessment Upper Extremity Assessment Upper Extremity  Assessment: Overall WFL for tasks assessed Lower Extremity Assessment Lower Extremity Assessment: LLE deficits/detail LLE Deficits / Details: Decreased strength and ROM due  to surgery Cervical / Trunk Assessment Cervical / Trunk Assessment: Normal   Balance    End of Session PT - End of Session Equipment Utilized During Treatment: Gait belt;Left knee immobilizer Activity Tolerance: Patient tolerated treatment well Patient left: in chair;with call bell/phone within reach Nurse Communication: Mobility status CPM Left Knee CPM Left Knee: Off  GP     Steven Austin 01/24/2014, 7:47 PM Steven Austin, [place] Pager [phone]

## 2014-01-24 NOTE — Anesthesia Procedure Notes (Signed)
Procedure Name: Intubation Date/Time: 01/24/2014 8:58 AM Performed by: Wanita Chamberlain Pre-anesthesia Checklist: Patient identified, Timeout performed, Emergency Drugs available, Suction available and Patient being monitored Patient Re-evaluated:Patient Re-evaluated prior to inductionOxygen Delivery Method: Circle system utilized Preoxygenation: Pre-oxygenation with 100% oxygen Intubation Type: IV induction Ventilation: Mask ventilation without difficulty and Oral airway inserted - appropriate to patient size Laryngoscope Size: Mac and 3 Grade View: Grade I Tube type: Oral Tube size: 7.5 mm Number of attempts: 1 Airway Equipment and Method: Stylet Placement Confirmation: ETT inserted through vocal cords under direct vision and positive ETCO2 Secured at: 3 cm Tube secured with: Tape Dental Injury: Teeth and Oropharynx as per pre-operative assessment

## 2014-01-24 NOTE — Anesthesia Postprocedure Evaluation (Signed)
Anesthesia Post Note  Patient: Steven Austin  Procedure(s) Performed: Procedure(s) (LRB): LEFT TOTAL KNEE ARTHROPLASTY (Left)  Anesthesia type: general  Patient location: PACU  Post pain: Pain level controlled  Post assessment: Patient's Cardiovascular Status Stable  Last Vitals:  Filed Vitals:   01/24/14 1200  BP: 147/81  Pulse: 92  Temp: 36.7 C  Resp: 15    Post vital signs: Reviewed and stable  Level of consciousness: sedated  Complications: No apparent anesthesia complications

## 2014-01-24 NOTE — Preoperative (Signed)
Beta Blockers   Reason not to administer Beta Blockers:Not Applicable 

## 2014-01-24 NOTE — H&P (View-Only) (Signed)
TOTAL KNEE ADMISSION H&P  Patient is being admitted for left total knee arthroplasty.  Subjective:  Chief Complaint:left knee pain.  HPI: Steven Austin, 68 y.o. male, has a history of pain and functional disability in the left knee due to arthritis and has failed non-surgical conservative treatments for greater than 12 weeks to includeNSAID's and/or analgesics, corticosteriod injections, viscosupplementation injections, flexibility and strengthening excercises, supervised PT with diminished ADL's post treatment, use of assistive devices and activity modification.  Onset of symptoms was gradual, starting 10 years ago with gradually worsening course since that time. The patient noted no past surgery on the left knee(s).  Patient currently rates pain in the left knee(s) at 10 out of 10 with activity. Patient has night pain, worsening of pain with activity and weight bearing, pain that interferes with activities of daily living, crepitus and joint swelling.  Patient has evidence of subchondral sclerosis, periarticular osteophytes and joint space narrowing by imaging studies. There is no active infection.  Patient Active Problem List   Diagnosis Date Noted  . Hypertension   . Hearing loss   . Hyperlipidemia   . Left knee DJD    Past Medical History  Diagnosis Date  . Hypertension   . Hearing loss   . Hyperlipidemia   . Left knee DJD     Past Surgical History  Procedure Laterality Date  . Colon surgery    . Leg surgery    . Colonoscopy       (Not in a hospital admission) Allergies  Allergen Reactions  . Augmentin [Amoxicillin-Pot Clavulanate] Rash     Current Outpatient Prescriptions on File Prior to Visit  Medication Sig Dispense Refill  . aspirin 81 MG tablet Take 81 mg by mouth daily.      Marland Kitchen atorvastatin (LIPITOR) 20 MG tablet Take 20 mg by mouth daily.      . beta carotene w/minerals (OCUVITE) tablet Take 1 tablet by mouth daily.      . fenofibrate 160 MG tablet Take 160 mg  by mouth daily.      Marland Kitchen glucosamine-chondroitin 500-400 MG tablet Take 1 tablet by mouth 3 (three) times daily.      . hydrochlorothiazide (HYDRODIURIL) 25 MG tablet Take 25 mg by mouth daily.      Marland Kitchen NIFEdipine (PROCARDIA-XL/ADALAT CC) 60 MG 24 hr tablet Take 60 mg by mouth daily.       No current facility-administered medications on file prior to visit.   History  Substance Use Topics  . Smoking status: Former Smoker    Quit date: 01/13/1972  . Smokeless tobacco: Not on file  . Alcohol Use: Yes     Comment: 2-3 drinks a day    Family History  Problem Relation Age of Onset  . Cancer Mother   . Lung disease Father   . Breast cancer Sister      Review of Systems  Constitutional: Negative.   HENT: Negative.   Eyes: Negative.   Respiratory: Negative.   Cardiovascular: Negative.   Genitourinary: Negative.   Musculoskeletal: Positive for joint pain.  Skin: Negative.   Neurological: Negative.   Endo/Heme/Allergies: Negative.   Psychiatric/Behavioral: Negative.     Objective:  Physical Exam  Constitutional: He is oriented to person, place, and time. He appears well-developed and well-nourished.  HENT:  Head: Normocephalic and atraumatic.  Mouth/Throat: Oropharynx is clear and moist.  Eyes: Conjunctivae and EOM are normal. Pupils are equal, round, and reactive to light.  Neck: Normal range of motion.  Cardiovascular: Normal rate and normal heart sounds.   Occasional extra beat  Respiratory: Effort normal and breath sounds normal.  GI: Soft. Bowel sounds are normal.  Genitourinary:  Not pertinent to current symptomatology therefore not examined.  Musculoskeletal:  Examination of his left knee reveals pain medially and laterally 1+ crepitation 1+ synovitis range of motion from -5 to 125 degrees knee is stable with normal patella tracking. Exam of the right knee reveals 1+ crepitation 1+ synovitis full range of motion knee is stable with normal patella tracking. Vascular exam:  pulses 2+ and symmetric.  Neurological: He is alert and oriented to person, place, and time.  Skin: Skin is warm and dry.  Psychiatric: He has a normal mood and affect. His behavior is normal.    Vital signs in last 24 hours: Last recorded: 03/11 1500   BP: 142/87 Pulse: 80  Temp: 98.1 F (36.7 C)    Height: 5\' 8"  (1.727 m) SpO2: 98  Weight: 89.812 kg (198 lb)     Labs:   Estimated body mass index is 30.11 kg/(m^2) as calculated from the following:   Height as of this encounter: 5\' 8"  (1.727 m).   Weight as of this encounter: 89.812 kg (198 lb).   Imaging Review X-RAYS: X-rays are reviewed from 10/14 that show medial compartment degenerative joint disease with bone on bone and mild tibiofemoral subluxation. Plain radiographs demonstrate severe degenerative joint disease of the left knee(s). The overall alignment issignificant varus. The bone quality appears to be good for age and reported activity level.  Assessment/Plan:  End stage arthritis, left knee Patient Active Problem List   Diagnosis Date Noted  . Hypertension   . Hearing loss   . Hyperlipidemia   . Left knee DJD      The patient history, physical examination, clinical judgment of the provider and imaging studies are consistent with end stage degenerative joint disease of the left knee(s) and total knee arthroplasty is deemed medically necessary. The treatment options including medical management, injection therapy arthroscopy and arthroplasty were discussed at length. The risks and benefits of total knee arthroplasty were presented and reviewed. The risks due to aseptic loosening, infection, stiffness, patella tracking problems, thromboembolic complications and other imponderables were discussed. The patient acknowledged the explanation, agreed to proceed with the plan and consent was signed. Patient is being admitted for inpatient treatment for surgery, pain control, PT, OT, prophylactic antibiotics, VTE  prophylaxis, progressive ambulation and ADL's and discharge planning. The patient is planning to be discharged home with home health services  Drako Maese A. Kaleen Mask Physician Assistant [facility] [phone]  01/12/2014, 3:51 PM

## 2014-01-24 NOTE — Interval H&P Note (Signed)
History and Physical Interval Note:  01/24/2014 8:43 AM  Steven Austin  has presented today for surgery, with the diagnosis of DJD LEFT KNEE  The various methods of treatment have been discussed with the patient and family. After consideration of risks, benefits and other options for treatment, the patient has consented to  Procedure(s): LEFT TOTAL KNEE ARTHROPLASTY (Left) as a surgical intervention .  The patient's history has been reviewed, patient examined, no change in status, stable for surgery.  I have reviewed the patient's chart and labs.  Questions were answered to the patient's satisfaction.     Elsie Saas A

## 2014-01-24 NOTE — Anesthesia Preprocedure Evaluation (Addendum)
Anesthesia Evaluation  Patient identified by MRN, date of birth, ID band Patient awake    Reviewed: Allergy & Precautions, H&P , NPO status , Patient's Chart, lab work & pertinent test results  History of Anesthesia Complications Negative for: history of anesthetic complications  Airway Mallampati: II TM Distance: >3 FB Neck ROM: Full    Dental  (+) Teeth Intact, Dental Advisory Given,    Pulmonary neg pulmonary ROS, former smoker,  01-17-14 Chest x-ray IMPRESSION: No acute cardiopulmonary disease   Pulmonary exam normal       Cardiovascular Exercise Tolerance: Good hypertension, Pt. on medications  17-Jan-2014 Normal sinus rhythm Left anterior fascicular block Poor R wave progression Abnormal ECG Artifact No old tracing to compare   Neuro/Psych Anxiety negative neurological ROS  negative psych ROS   GI/Hepatic negative GI ROS, Neg liver ROS,   Endo/Other  negative endocrine ROS  Renal/GU negative Renal ROS     Musculoskeletal  (+) Arthritis -, Osteoarthritis,    Abdominal Normal abdominal exam  (+)   Peds  Hematology negative hematology ROS (+)   Anesthesia Other Findings Filling in upper left central incisor  Reproductive/Obstetrics                           Anesthesia Physical  Anesthesia Plan  ASA: II  Anesthesia Plan: General   Post-op Pain Management:    Induction: Intravenous  Airway Management Planned: Oral ETT  Additional Equipment:   Intra-op Plan:   Post-operative Plan: Extubation in OR  Informed Consent: I have reviewed the patients History and Physical, chart, labs and discussed the procedure including the risks, benefits and alternatives for the proposed anesthesia with the patient or authorized representative who has indicated his/her understanding and acceptance.   Dental advisory given  Plan Discussed with: CRNA, Anesthesiologist and  Surgeon  Anesthesia Plan Comments:         Anesthesia Quick Evaluation  

## 2014-01-24 NOTE — Op Note (Signed)
MRN:     [id number] DOB/AGE:    1946/08/01 / 68 y.o.       OPERATIVE REPORT    DATE OF PROCEDURE:  01/24/2014       PREOPERATIVE DIAGNOSIS:   DJD LEFT KNEE      There is no weight on file to calculate BMI.                                                        POSTOPERATIVE DIAGNOSIS:   DJD LEFT KNEE                                                                      PROCEDURE:  Procedure(s): LEFT TOTAL KNEE ARTHROPLASTY Using Depuy Sigma RP implants #4 Femur, #4Tibia, 12.73mm sigma RP bearing, 35 Patella     SURGEON: Dezarae Mcclaran A    ASSISTANT:  Kirstin Shepperson PA-C   (Present and scrubbed throughout the case, critical for assistance with exposure, retraction, instrumentation, and closure.)         ANESTHESIA: GET with Femoral Nerve Block  DRAINS: foley, 2 medium hemovac in knee   TOURNIQUET TIME: 92EQA   COMPLICATIONS:  None     SPECIMENS: None   INDICATIONS FOR PROCEDURE: The patient has  DJD LEFT KNEE, varus deformities, XR shows bone on bone arthritis. Patient has failed all conservative measures including anti-inflammatory medicines, narcotics, attempts at  exercise and weight loss, cortisone injections and viscosupplementation.  Risks and benefits of surgery have been discussed, questions answered.   DESCRIPTION OF PROCEDURE: The patient identified by armband, received  right femoral nerve block and IV antibiotics, in the holding area at [facility]. Patient taken to the operating room, appropriate anesthetic  monitors were attached General endotracheal anesthesia induced with  the patient in supine position, Foley catheter was inserted. Tourniquet  applied high to the operative thigh. Lateral post and foot positioner  applied to the table, the lower extremity was then prepped and draped  in usual sterile fashion from the ankle to the tourniquet. Time-out procedure was performed. The limb was wrapped with an Esmarch bandage and the tourniquet inflated to 365  mmHg. We began the operation by making the anterior midline incision starting at handbreadth above the patella going over the patella 1 cm medial to and  4 cm distal to the tibial tubercle. Small bleeders in the skin and the  subcutaneous tissue identified and cauterized. Transverse retinaculum was incised and reflected medially and a medial parapatellar arthrotomy was accomplished. the patella was everted and theprepatellar fat pad resected. The superficial medial collateral  ligament was then elevated from anterior to posterior along the proximal  flare of the tibia and anterior half of the menisci resected. The knee was hyperflexed exposing bone on bone arthritis. Peripheral and notch osteophytes as well as the cruciate ligaments were then resected. We continued to  work our way around posteriorly along the proximal tibia, and externally  rotated the tibia subluxing it out from underneath the femur. A McHale  retractor was placed through the notch and a lateral [employer]  retractor  placed, and we then drilled through the proximal tibia in line with the  axis of the tibia followed by an intramedullary guide rod and 2-degree  posterior slope cutting guide. The tibial cutting guide was pinned into place  allowing resection of 4 mm of bone medially and about 6 mm of bone  laterally because of her varus deformity. Satisfied with the tibial resection, we then  entered the distal femur 2 mm anterior to the PCL origin with the  intramedullary guide rod and applied the distal femoral cutting guide  set at 45mm, with 5 degrees of valgus. This was pinned along the  epicondylar axis. At this point, the distal femoral cut was accomplished without difficulty. We then sized for a #4 femoral component and pinned the guide in 3 degrees of external rotation.The chamfer cutting guide was pinned into place. The anterior, posterior, and chamfer cuts were accomplished without difficulty followed by  the Sigma RP box  cutting guide and the box cut. We also removed posterior osteophytes from the posterior femoral condyles. At this  time, the knee was brought into full extension. We checked our  extension and flexion gaps and found them symmetric at 12.32mm.  The patella thickness measured at 23 mm. We set the cutting guide at 14 and removed the posterior 9.5-10 mm  of the patella sized for 35 button and drilled the lollipop. The knee  was then once again hyperflexed exposing the proximal tibia. We sized for a #4 tibial base plate, applied the smokestack and the conical reamer followed by the the Delta fin keel punch. We then hammered into place the Sigma RP trial femoral component, inserted a 12.5-mm trial bearing, trial patellar button, and took the knee through range of motion from 0-130 degrees. No thumb pressure was required for patellar  tracking. At this point, all trial components were removed, a double batch of DePuy HV cement  was mixed and applied to all bony metallic mating surfaces except for the posterior condyles of the femur itself. In order, we  hammered into place the tibial tray and removed excess cement, the femoral component and removed excess cement, a 12.5-mm Sigma RP bearing  was inserted, and the knee brought to full extension with compression.  The patellar button was clamped into place, and excess cement  removed. While the cement cured the wound was irrigated out with normal saline solution pulse lavage, and medium Hemovac drains were placed.. Ligament stability and patellar tracking were checked and found to be excellent. The tourniquet was then released and hemostasis was obtained with cautery. The parapatellar arthrotomy was closed with  #1 ethibond suture. The subcutaneous tissue with 0 and 2-0 undyed  Vicryl suture, and 4-0 Monocryl.. A dressing of Xeroform,  4 x 4, dressing sponges, Webril, and Ace wrap applied. Needle and sponge count were correct times 2.The patient awakened,  extubated, and taken to recovery room without difficulty. Vascular status was normal, pulses 2+ and symmetric.   Garl Speigner A 01/24/2014, 10:17 AM

## 2014-01-25 ENCOUNTER — Encounter (HOSPITAL_COMMUNITY): Payer: Self-pay | Admitting: Orthopedic Surgery

## 2014-01-25 LAB — BASIC METABOLIC PANEL
BUN: 19 mg/dL (ref 6–23)
CHLORIDE: 103 meq/L (ref 96–112)
CO2: 22 mEq/L (ref 19–32)
Calcium: 8.8 mg/dL (ref 8.4–10.5)
Creatinine, Ser: 0.99 mg/dL (ref 0.50–1.35)
GFR calc Af Amer: >90 mL/min (ref >90)
GFR, EST NON AFRICAN AMERICAN: 83 mL/min — AB (ref >90)
GLUCOSE: 156 mg/dL — AB (ref 70–99)
POTASSIUM: 3.9 meq/L (ref 3.7–5.3)
SODIUM: 139 meq/L (ref 137–147)

## 2014-01-25 LAB — CBC
HCT: 35.3 % — ABNORMAL LOW (ref 39.0–52.0)
Hemoglobin: 12.3 g/dL — ABNORMAL LOW (ref 13.0–17.0)
MCH: 31.8 pg (ref 26.0–34.0)
MCHC: 34.8 g/dL (ref 30.0–36.0)
MCV: 91.2 fL (ref 78.0–100.0)
Platelets: 197 10*3/uL (ref 150–400)
RBC: 3.87 MIL/uL — ABNORMAL LOW (ref 4.22–5.81)
RDW: 13.1 % (ref 11.5–15.5)
WBC: 13.9 10*3/uL — ABNORMAL HIGH (ref 4.0–10.5)

## 2014-01-25 NOTE — Progress Notes (Signed)
Physical Therapy Treatment Patient Details Name: Steven Austin MRN: [id number] DOB: 09/27/1946 Today's Date: 01/25/2014    History of Present Illness Patient is a 68 yo male s/p Lt TKA.    PT Comments    Pt continues to make great progress.  Pt is ready for D/C from PT standpoint.  Pt and wife nervous about D/C to home as wife is not able to provide much assistance, however max reassurance given about how well pt is progressing and his readiness for home.    Follow Up Recommendations  Home health PT;Supervision - Intermittent     Equipment Recommendations  None recommended by PT    Recommendations for Other Services       Precautions / Restrictions Precautions Precautions: Knee Precaution Booklet Issued: Yes (comment) Precaution Comments: pt continues to be able to perform SLR x10.  No KI needed. Required Braces or Orthoses: Knee Immobilizer - Left Restrictions Weight Bearing Restrictions: Yes LLE Weight Bearing: Weight bearing as tolerated    Mobility  Bed Mobility Overal bed mobility: Modified Independent             General bed mobility comments:  (pt in chair at start of session. Min A for donning KI.)  Transfers Overall transfer level: Modified independent Equipment used: Rolling walker (2 wheeled) Transfers: Sit to/from Stand Sit to Stand: Modified independent (Device/Increase time)         General transfer comment: vc for hand placement, legs touching chair/toilet before sitting down  Ambulation/Gait Ambulation/Gait assistance: Supervision Ambulation Distance (Feet): 300 Feet Assistive device: Rolling walker (2 wheeled) Gait Pattern/deviations: Step-through pattern;Decreased stride length;Decreased step length - right;Decreased stance time - left     General Gait Details: cues for normalizing step lengths and R LE stepping past L LE.     Stairs Stairs: Yes Stairs assistance: Min guard Stair Management: One rail Right;Step to  pattern;Forwards Number of Stairs: 11 General stair comments: pt demos good carry over for stair technique from this am.  pt able to do flight of stairs in stairwell.  Wife present for education.    Wheelchair Mobility    Modified Rankin (Stroke Patients Only)       Balance                                    Cognition Arousal/Alertness: Awake/alert Behavior During Therapy: WFL for tasks assessed/performed Overall Cognitive Status: Within Functional Limits for tasks assessed                      Exercises      General Comments        Pertinent Vitals/Pain By end of session 5/10.  Pt to call for pain meds.      Home Living Family/patient expects to be discharged to:: Private residence Living Arrangements: Spouse/significant other Available Help at Discharge: Family;Available 24 hours/day Type of Home: House Home Access: Level entry   Home Layout: Multi-level;Able to live on main level with bedroom/bathroom Home Equipment: Gilford Rile - 2 wheels;Bedside commode;Tub bench;Cane - single point Additional Comments: pt states he will be borrowing tub transfer bench from friend    Prior Function Level of Independence: Independent          PT Goals (current goals can now be found in the care plan section) Acute Rehab PT Goals Patient Stated Goal: Home Time For Goal Achievement: 01/31/14 Potential to Achieve Goals: Good Progress  towards PT goals: Progressing toward goals    Frequency  7X/week    PT Plan Current plan remains appropriate    End of Session Equipment Utilized During Treatment: Gait belt Activity Tolerance: Patient tolerated treatment well Patient left: in chair;with call bell/phone within reach;with family/visitor present     Time: 2778-2423 PT Time Calculation (min): 29 min  Charges:  $Gait Training: 23-37 mins                    G CodesCatarina Hartshorn, [place] 01/25/2014, 1:54 PM

## 2014-01-25 NOTE — Care Management Note (Signed)
CARE MANAGEMENT NOTE 01/25/2014  Patient:  Steven Austin, Steven Austin   Account Number:  [id number]  Date Initiated:  01/25/2014  Documentation initiated by:  Ricki Miller  Subjective/Objective Assessment:   68 yr old male s/p left total knee arthroplasty.     Action/Plan:   Case manager spoke with patient concerning home health and DME needs at discharge. Choice offered. Referral called to [facility], [place].DME has been delivered. Has family support at discharge.   Anticipated DC Date:  01/26/2014   Anticipated DC Plan:  [place]  CM consult      [facility] Choice  HOME HEALTH  DURABLE MEDICAL EQUIPMENT   Choice offered to / List presented to:  C-1 Patient   DME arranged  [facility]  3-N-1  [facility]      DME agency  [employer]     [place] arranged  HH-2 PT      [place].   Status of service:  Completed, signed off Medicare Important Message given?   (If response is "NO", the following Medicare IM given date fields will be blank) Date Medicare IM given:   Date Additional Medicare IM given:    Discharge Disposition:  [place]

## 2014-01-25 NOTE — Progress Notes (Signed)
Physical Therapy Treatment Patient Details Name: Steven Austin MRN: [id number] DOB: [birth year] Today's Date: 01/25/2014 Time: 4098-1191 PT Time Calculation (min): 38 min  PT Assessment / Plan / Recommendation  History of Present Illness Patient is a 68 yo male s/p Lt TKA.   PT Comments   Pt needs encouragement to build confidence, however is making great progress.  Will continue to follow.    Follow Up Recommendations  Home health PT;Supervision - Intermittent     Does the patient have the potential to tolerate intense rehabilitation     Barriers to Discharge        Equipment Recommendations  None recommended by PT    Recommendations for Other Services    Frequency 7X/week   Progress towards PT Goals Progress towards PT goals: Progressing toward goals  Plan Current plan remains appropriate    Precautions / Restrictions Precautions Precautions: Knee Precaution Comments: pt able to perform SLR x10, no longer using KI.   Restrictions Weight Bearing Restrictions: Yes LLE Weight Bearing: Weight bearing as tolerated   Pertinent Vitals/Pain 1/10 prior to PT and 6/10 with ROM.  Pt premedicated.      Mobility  Bed Mobility Overal bed mobility: Needs Assistance Bed Mobility: Supine to Sit Supine to sit: Supervision General bed mobility comments: Encouragement to try without A.   Transfers Overall transfer level: Needs assistance Equipment used: Rolling walker (2 wheeled) Transfers: Sit to/from Stand Sit to Stand: Min guard General transfer comment: cues for UE use and controlling descent to sitting.   Ambulation/Gait Ambulation/Gait assistance: Min guard Ambulation Distance (Feet): 200 Feet Assistive device: Rolling walker (2 wheeled) Gait Pattern/deviations: Step-through pattern;Decreased stride length;Decreased step length - right;Decreased stance time - left General Gait Details: cues for normalizing step lengths and R LE stepping past L LE.   Stairs: Yes Stairs  assistance: Min guard Stair Management: One rail Right;Step to pattern;Forwards Number of Stairs: 4 General stair comments: cues for stair sequencing and encouragement.      Exercises Total Joint Exercises Ankle Circles/Pumps: AROM;Both;10 reps;Seated Quad Sets: AROM;Both;10 reps Hip ABduction/ADduction: AROM;Left;10 reps Straight Leg Raises: AROM;Left;10 reps Long Arc Quad: AROM;Left;10 reps Knee Flexion: AROM;Left;10 reps Goniometric ROM: PROM ~5 - 80   PT Diagnosis:    PT Problem List:   PT Treatment Interventions:     PT Goals (current goals can now be found in the care plan section) Acute Rehab PT Goals Patient Stated Goal: To go home soon Time For Goal Achievement: 01/31/14 Potential to Achieve Goals: Good  Visit Information  Last PT Received On: 01/25/14 Assistance Needed: +1 History of Present Illness: Patient is a 68 yo male s/p Lt TKA.    Subjective Data  Patient Stated Goal: To go home soon   Cognition  Cognition Arousal/Alertness: Awake/alert Behavior During Therapy: WFL for tasks assessed/performed Overall Cognitive Status: Within Functional Limits for tasks assessed    Balance     End of Session PT - End of Session Equipment Utilized During Treatment: Gait belt Activity Tolerance: Patient tolerated treatment well Patient left: in chair;with call bell/phone within reach Nurse Communication: Mobility status   GP     Catarina Hartshorn, [place] 01/25/2014, 9:29 AM

## 2014-01-25 NOTE — Evaluation (Signed)
Occupational Therapy Evaluation Patient Details Name: Steven Austin MRN: [id number] DOB: 02-17-1946 Today's Date: 01/25/2014    History of Present Illness Patient is a 68 yo male s/p Lt TKA.   Clinical Impression   Pt presents with below problem list. Pt PLOF is independent. Pt able to transfer and ambulate supervision to min guard. Educated on safe completion of ADLs while observing knee precautions, showed AE for LB bathing and dressing. Educated on shower transfer using tub transfer bench. Pt indicated understanding technique and did not wish to practice. Continue acute OT services to address goals below    Follow Up Recommendations  No OT follow up    Equipment Recommendations  3 in 1 bedside comode    Recommendations for Other Services       Precautions / Restrictions Precautions Precautions: Knee Precaution Booklet Issued: Yes (comment) Precaution Comments: Reviewed precautions with patient. Required Braces or Orthoses: Knee Immobilizer - Left Restrictions Weight Bearing Restrictions: Yes LLE Weight Bearing: Weight bearing as tolerated      Mobility Bed MobilityGeneral bed mobility comments:  (pt in chair at start of session. Min A for donning KI.)  Transfers Overall transfer level: Needs assistance Equipment used: Rolling walker (2 wheeled) Transfers: Sit to/from Stand Sit to Stand: Min guard General transfer comment: vc for hand placement, legs touching chair/toilet before sitting down         ADL Eating/Feeding: Set up Grooming: Set up   Upper Body Dressing : Set up Lower Body Bathing: Minimal assistance;Min guard Lower Body Dressing: Min guard;Minimal assistance Toilet Transfer: Min guard Toileting- Clothing Manipulation and Hygiene: Min guard;Minimal assistance Tub/ Shower Transfer: Min guard Functional mobility during ADLs: Min guard;Rolling walker General ADL Comments: Educated on safe completion of ADLs while observing back precautions. Pt  plans to borrow tub transfer bench from a neighbor. Educated and demonstrated tub transfer technique, pt indicated understanding and did not wish to practice tub transfer. Min guard to min A for LB bathing, dressing, toilet clothing manipulation.            Pertinent Vitals/Pain 4/10 left knee. Increased activity during session.    Extremity/Trunk Assessment Upper Extremity Assessment Upper Extremity Assessment: Overall WFL for tasks assessed   Lower Extremity Assessment Lower Extremity Assessment: Defer to PT evaluation       Communication Communication Communication: HOH   Cognition Arousal/Alertness: Awake/alert Behavior During Therapy: WFL for tasks assessed/performed Overall Cognitive Status: Within Functional Limits for tasks assessed            Home Living Family/patient expects to be discharged to:: Private residence Living Arrangements: Spouse/significant other Available Help at Discharge: Family;Available 24 hours/day Type of Home: House Home Access: Level entry Home Layout: Multi-level;Able to live on main level with bedroom/bathroom     Bathroom Shower/Tub: Teacher, early years/pre: Standard     Home Equipment: Environmental consultant - 2 wheels;Bedside commode;Tub bench;Cane - single point   Additional Comments: pt states he will be borrowing tub transfer bench from friend      Prior Functioning/Environment Level of Independence: Independent             OT Diagnosis:  Acute Pain   OT Problem List: Decreased range of motion;Decreased knowledge of use of DME or AE;Decreased knowledge of precautions;Pain;Impaired balance (sitting and/or standing);Decreased strength   OT Treatment/Interventions: Self-care/ADL training;Therapeutic exercise;DME and/or AE instruction;Therapeutic activities;Patient/family education;Balance training    OT Goals(Current goals can be found in the care plan section) Acute Rehab OT Goals Patient Stated  Goal: to work on [employer]  dressing OT Goal Formulation: With patient Time For Goal Achievement: 02/01/14 Potential to Achieve Goals: Good ADL Goals Pt Will Perform Lower Body Bathing: with supervision;with adaptive equipment;sitting/lateral leans;sit to/from stand Pt Will Perform Lower Body Dressing: with set-up;with supervision;with adaptive equipment;sit to/from stand Pt Will Perform Toileting - Clothing Manipulation and hygiene: with supervision;with set-up;with adaptive equipment;sit to/from stand  OT Frequency: Min 2X/week           End of Session: Equipment Utilized During Treatment: Gait belt;Rolling walker;Left knee immobilizer     Activity Tolerance: Patient tolerated treatment well Patient left: in chair;with call bell/phone within reach   Time: 0854-0928 OT Time Calculation (min): 34 min Charges:  OT General Charges $OT Visit: 1 Procedure OT Evaluation $Initial OT Evaluation Tier I: 1 Procedure OT Treatments $Self Care/Home Management : 23-37 mins G-Codes:    Tyrone Schimke OTR/L Pager: [phone]  01/25/2014, 1:19 PM

## 2014-01-25 NOTE — Progress Notes (Signed)
Subjective: 1 Day Post-Op Procedure(s) (LRB): LEFT TOTAL KNEE ARTHROPLASTY (Left) Patient reports pain as 4 on 0-10 scale.    Objective: Vital signs in last 24 hours: Temp:  [97.5 F (36.4 C)-98.7 F (37.1 C)] 97.9 F (36.6 C) (03/24 0558) Pulse Rate:  [78-92] 79 (03/24 0558) Resp:  [8-18] 18 (03/24 0558) BP: (136-149)/(66-89) 140/83 mmHg (03/24 0558) SpO2:  [90 %-97 %] 97 % (03/24 0558)  Intake/Output from previous day: 03/23 0701 - 03/24 0700 In: 2085 [P.O.:360; I.V.:1450] Out: 3850 [Urine:3050; Drains:800] Intake/Output this shift:     Recent Labs  01/25/14 0620  HGB 12.3*    Recent Labs  01/25/14 0620  WBC 13.9*  RBC 3.87*  HCT 35.3*  PLT 197    Recent Labs  01/25/14 0620  NA 139  K 3.9  CL 103  CO2 22  BUN 19  CREATININE 0.99  GLUCOSE 156*  CALCIUM 8.8   No results found for this basename: LABPT, INR,  in the last 72 hours  Neurologically intact ABD soft Neurovascular intact Sensation intact distally Intact pulses distally Dorsiflexion/Plantar flexion intact Incision: scant drainage  Assessment/Plan: 1 Day Post-Op Procedure(s) (LRB): LEFT TOTAL KNEE ARTHROPLASTY (Left) Advance diet Up with therapy D/C IV fluids Plan for discharge tomorrow  Linda Hedges 01/25/2014, 8:59 AM

## 2014-01-26 DIAGNOSIS — R739 Hyperglycemia, unspecified: Secondary | ICD-10-CM | POA: Diagnosis present

## 2014-01-26 LAB — CBC
HEMATOCRIT: 35.1 % — AB (ref 39.0–52.0)
HEMOGLOBIN: 11.8 g/dL — AB (ref 13.0–17.0)
MCH: 31.2 pg (ref 26.0–34.0)
MCHC: 33.6 g/dL (ref 30.0–36.0)
MCV: 92.9 fL (ref 78.0–100.0)
Platelets: 184 10*3/uL (ref 150–400)
RBC: 3.78 MIL/uL — AB (ref 4.22–5.81)
RDW: 13.5 % (ref 11.5–15.5)
WBC: 12 10*3/uL — ABNORMAL HIGH (ref 4.0–10.5)

## 2014-01-26 LAB — BASIC METABOLIC PANEL
BUN: 21 mg/dL (ref 6–23)
CO2: 26 mEq/L (ref 19–32)
Calcium: 9.1 mg/dL (ref 8.4–10.5)
Chloride: 104 mEq/L (ref 96–112)
Creatinine, Ser: 0.98 mg/dL (ref 0.50–1.35)
GFR calc Af Amer: >90 mL/min (ref >90)
GFR calc non Af Amer: 83 mL/min — ABNORMAL LOW (ref >90)
GLUCOSE: 98 mg/dL (ref 70–99)
POTASSIUM: 3.8 meq/L (ref 3.7–5.3)
SODIUM: 142 meq/L (ref 137–147)

## 2014-01-26 MED ORDER — DSS 100 MG PO CAPS: ORAL_CAPSULE | ORAL | Status: DC

## 2014-01-26 MED ORDER — ACETAMINOPHEN 325 MG PO TABS: 650.0000 mg | ORAL_TABLET | Freq: Four times a day (QID) | ORAL | Status: DC | PRN

## 2014-01-26 MED ORDER — CELECOXIB 200 MG PO CAPS: ORAL_CAPSULE | ORAL | Status: DC

## 2014-01-26 MED ORDER — OXYCODONE HCL 5 MG PO TABS: ORAL_TABLET | ORAL | Status: DC

## 2014-01-26 MED ORDER — ASPIRIN 325 MG PO TBEC: DELAYED_RELEASE_TABLET | ORAL | Status: DC

## 2014-01-26 MED ORDER — BISACODYL 5 MG PO TBEC: DELAYED_RELEASE_TABLET | ORAL | Status: DC

## 2014-01-26 NOTE — Discharge Summary (Signed)
Patient ID: Steven Austin MRN: [id number] DOB/AGE: 1946-03-20 68 y.o.  Admit date: 01/24/2014 Discharge date: 01/26/2014  Admission Diagnoses:  Principal Problem:   Left knee DJD Active Problems:   Hypertension   Hearing loss   Hyperlipidemia   DJD (degenerative joint disease) of knee   Blood glucose elevated   Discharge Diagnoses:  Same  Past Medical History  Diagnosis Date  . Hypertension   . Hearing loss   . Hyperlipidemia   . Complication of anesthesia     "bothered by ether in the [place]"   . Left knee DJD   . Arthritis     "knees" (01/24/2014)    Surgeries: Procedure(s): LEFT TOTAL KNEE ARTHROPLASTY on 01/24/2014   Consultants:    Discharged Condition: Improved  Hospital Course: Steven Austin is an 68 y.o. male who was admitted 01/24/2014 for operative treatment ofLeft knee DJD. Patient has severe unremitting pain that affects sleep, daily activities, and work/hobbies. After pre-op clearance the patient was taken to the operating room on 01/24/2014 and underwent  Procedure(s): LEFT TOTAL KNEE ARTHROPLASTY.    Patient was given perioperative antibiotics: Anti-infectives   Start     Dose/Rate Route Frequency Ordered Stop   01/24/14 1230  clindamycin (CLEOCIN) IVPB 600 mg     600 mg 100 mL/hr over 30 Minutes Intravenous Every 6 hours 01/24/14 1219 01/24/14 2256   01/24/14 0600  clindamycin (CLEOCIN) IVPB 900 mg     900 mg 100 mL/hr over 30 Minutes Intravenous On call to O.R. 01/23/14 1404 01/24/14 0851       Patient was given sequential compression devices, early ambulation, and chemoprophylaxis to prevent DVT.    Patient benefited maximally from hospital stay and there were no complications.    Recent vital signs: Patient Vitals for the past 24 hrs:  BP Temp Temp src Pulse Resp SpO2  01/26/14 0548 159/87 mmHg 97.7 F (36.5 C) Oral 69 18 100 %  01/26/14 0400 - - - - 16 -  01/26/14 0000 - - - - 16 -  01/25/14 2043 161/85 mmHg 98 F (36.7 C) Oral  86 18 96 %  01/25/14 2000 - - - - 17 -  01/25/14 1600 - - - - 18 -  01/25/14 1430 144/73 mmHg 98 F (36.7 C) Oral 80 18 -  01/25/14 1200 146/86 mmHg 98.2 F (36.8 C) Oral 76 16 96 %  01/25/14 1024 141/89 mmHg - - - - -  01/25/14 0800 141/89 mmHg 98.6 F (37 C) Oral 88 18 91 %     Recent laboratory studies:  Recent Labs  01/25/14 0620 01/26/14 0540  WBC 13.9* 12.0*  HGB 12.3* 11.8*  HCT 35.3* 35.1*  PLT 197 184  NA 139 142  K 3.9 3.8  CL 103 104  CO2 22 26  BUN 19 21  CREATININE 0.99 0.98  GLUCOSE 156* 98  CALCIUM 8.8 9.1     Discharge Medications:     Medication List    STOP taking these medications       aspirin 81 MG tablet  Replaced by:  aspirin 325 MG EC tablet     glucosamine-chondroitin 500-400 MG tablet      TAKE these medications       acetaminophen 325 MG tablet  Commonly known as:  TYLENOL  Take 2 tablets (650 mg total) by mouth every 6 (six) hours as needed for mild pain (or Fever >/= 101).     aspirin 325 MG EC tablet  1  tab a day for the next 30 days to prevent blood clots     atorvastatin 20 MG tablet  Commonly known as:  LIPITOR  Take 20 mg by mouth daily.     beta carotene w/minerals tablet  Take 1 tablet by mouth daily.     bisacodyl 5 MG EC tablet  Commonly known as:  DULCOLAX  Take 2 tablets every night with dinner until bowel movement.  LAXITIVE.  Restart if two days since last bowel movement     celecoxib 200 MG capsule  Commonly known as:  CELEBREX  1 tab po q day with food for pain and  swelling     DSS 100 MG Caps  1 tab 2 times a day while on narcotics.  STOOL SOFTENER     fenofibrate 160 MG tablet  Take 160 mg by mouth daily.     hydrochlorothiazide 25 MG tablet  Commonly known as:  HYDRODIURIL  Take 25 mg by mouth daily.     NIFEdipine 60 MG 24 hr tablet  Commonly known as:  PROCARDIA-XL/ADALAT CC  Take 60 mg by mouth daily.     oxyCODONE 5 MG immediate release tablet  Commonly known as:  Oxy IR/ROXICODONE   1-2 tablets every 4-6 hrs as needed for pain        Diagnostic Studies: Dg Chest 2 View  01/17/2014   CLINICAL DATA:  Degenerative knee disease. Scheduled for knee surgery.  EXAM: CHEST  2 VIEW  COMPARISON:  None.  FINDINGS: The lung markings are mildly prominent but no focal airspace disease and no evidence for pulmonary edema. Heart and mediastinum are within normal limits. Difficult to exclude a compression deformity in the lower thoracic spine but this area is poorly characterized.  IMPRESSION: No acute cardiopulmonary disease.   Electronically Signed   By: Markus Daft M.D.   On: 01/17/2014 09:06    Disposition: Final discharge disposition not confirmed      Discharge Orders   Future Orders Complete By Expires   Call MD / Call 911  As directed    Comments:     If you experience chest pain or shortness of breath, CALL 911 and be transported to the hospital emergency room.  If you develope a fever above 101 F, pus (white drainage) or increased drainage or redness at the wound, or calf pain, call your surgeon's office.   Change dressing  As directed    Comments:     Change the dressing daily with sterile 4 x 4 inch gauze dressing and apply TED hose.  You may clean the incision with alcohol prior to redressing.   Constipation Prevention  As directed    Comments:     Drink plenty of fluids.  Prune juice may be helpful.  You may use a stool softener, such as Colace (over the counter) 100 mg twice a day.  Use MiraLax (over the counter) for constipation as needed.   CPM  As directed    Comments:     Continuous passive motion machine (CPM):      Use the CPM from 0 to 90 for 6 hours per day.       You may break it up into 2 or 3 sessions per day.      Use CPM for 2 weeks or until you are told to stop.   Diet - low sodium heart healthy  As directed    Discharge instructions  As directed    Comments:  Total Knee Replacement Care After Refer to this sheet in the next few weeks. These  discharge instructions provide you with general information on caring for yourself after you leave the hospital. Your caregiver may also give you specific instructions. Your treatment has been planned according to the most current medical practices available, but unavoidable complications sometimes occur. If you have any problems or questions after discharge, please call your caregiver. Regaining a near full range of motion of your knee within the first 3 to 6 weeks after surgery is critical. [place] may resume a normal diet and activities as directed.  Perform exercises as directed.  Place gray foam block, curve side up under heel at all times except when in CPM or when walking.  DO NOT modify, tear, cut, or change in any way the gray foam block. You will receive physical therapy daily  Take showers instead of baths until informed otherwise.  You may shower on Sunday.  Please wash whole leg including wound with soap and water  Change bandages (dressings)daily It is OK to take over-the-counter tylenol in addition to the oxycodone for pain, discomfort, or fever. Oxycodone is VERY constipating.  Please take stool softener twice a day and laxatives daily until bowels are regular Eat a well-balanced diet.  Avoid lifting or driving until you are instructed otherwise.  Make an appointment to see your caregiver for stitches (suture) or staple removal as directed.  If you have been sent home with a continuous passive motion machine (CPM machine), 0-90 degrees 6 hrs a day   2 hrs a shift SEEK MEDICAL CARE IF: You have swelling of your calf or leg.  You develop shortness of breath or chest pain.  You have redness, swelling, or increasing pain in the wound.  There is pus or any unusual drainage coming from the surgical site.  You notice a bad smell coming from the surgical site or dressing.  The surgical site breaks open after sutures or staples have been removed.  There is persistent  bleeding from the suture or staple line.  You are getting worse or are not improving.  You have any other questions or concerns.  SEEK IMMEDIATE MEDICAL CARE IF:  You have a fever.  You develop a rash.  You have difficulty breathing.  You develop any reaction or side effects to medicines given.  Your knee motion is decreasing rather than improving.  MAKE SURE YOU:  Understand these instructions.  Will watch your condition.  Will get help right away if you are not doing well or get worse.   Do not put a pillow under the knee. Place it under the heel.  As directed    Comments:     Place gray foam block, curve side up under heel at all times except when in CPM or when walking.  DO NOT modify, tear, cut, or change in any way the gray foam block.   Increase activity slowly as tolerated  As directed    TED hose  As directed    Comments:     Use stockings (TED hose) for 2 weeks on both leg(s).  You may remove them at night for sleeping.      Follow-up Information   Follow up with Lorn Junes, MD On 02/07/2014. (appt time 2:30 pm)    Specialty:  Orthopedic Surgery   Contact information:   [place] [address] [phone]       Follow  up with [facility]. (Someone from [facility] will contact you concerning start date and time for physical therapist . )    Contact information:   [address] [place]  [address] [phone]        Signed: Linda Hedges 01/26/2014, 7:19 AM

## 2014-01-26 NOTE — Progress Notes (Signed)
Physical Therapy Treatment Patient Details Name: Steven Austin MRN: [id number] DOB: 10-30-1946 Today's Date: 01/26/2014    History of Present Illness Patient is a 68 yo male s/p Lt TKA.    PT Comments    Good carryover from yesterday. Filled out HEP for patient to use at home.   Follow Up Recommendations  Home health PT;Supervision - Intermittent     Equipment Recommendations       Recommendations for Other Services       Precautions / Restrictions Precautions Precautions: Knee Precaution Comments: pt continues to be able to perform SLR x10.  No KI needed. Restrictions LLE Weight Bearing: Weight bearing as tolerated    Mobility  Bed Mobility Overal bed mobility: Modified Independent                Transfers Overall transfer level: Modified independent                  Ambulation/Gait Ambulation/Gait assistance: Modified independent (Device/Increase time) Ambulation Distance (Feet): 300 Feet     Gait velocity: Decreased       Stairs   Stairs assistance: Supervision Stair Management: One rail Right;Forwards;Step to pattern Number of Stairs: 11 General stair comments:   pt able to do flight of stairs in stairwell.  Wheelchair Mobility    Modified Rankin (Stroke Patients Only)       Balance                                    Cognition Arousal/Alertness: Awake/alert Behavior During Therapy: WFL for tasks assessed/performed Overall Cognitive Status: Within Functional Limits for tasks assessed                      Exercises Total Joint Exercises Quad Sets: AROM;Both;10 reps Hip ABduction/ADduction: AROM;Left;10 reps Straight Leg Raises: AROM;Left;10 reps Long Arc Quad: AROM;Left;10 reps Knee Flexion: AROM;Left;10 reps    General Comments        Pertinent Vitals/Pain no apparent distress     Home Living                      Prior Function            PT Goals (current goals can now be  found in the care plan section) Progress towards PT goals: Progressing toward goals    Frequency       PT Plan Current plan remains appropriate    End of Session Equipment Utilized During Treatment: Gait belt Activity Tolerance: Patient tolerated treatment well Patient left: in chair;with call bell/phone within reach;with family/visitor present     Time: 1045-1110 PT Time Calculation (min): 25 min  Charges:  $Gait Training: 8-22 mins $Therapeutic Exercise: 8-22 mins                    G Codes:      Jacqualyn Posey 01/26/2014, 1:39 PM 01/26/2014 Jacqualyn Posey PTA [phone] pager [phone] office

## 2014-01-26 NOTE — Progress Notes (Signed)
OT Cancellation Note  Patient Details Name: Steven Austin MRN: [id number] DOB: February 22, 1946   Cancelled Treatment:     Pt declined OT. Acute OT goals were regarding LB dressing/bathing. Pt stated he dressed himself this morning in preparation for going home. Pt able to state LB dressing technique (operated leg in first). Reviewed use of reacher if needed to maintain knee precautions when reaching toward L foot to don LB dressing.  Tyrone Schimke OTR/L Pager: [phone]  01/26/2014, 9:58 AM

## 2014-02-28 ENCOUNTER — Ambulatory Visit (INDEPENDENT_AMBULATORY_CARE_PROVIDER_SITE_OTHER): Payer: BC Managed Care – PPO | Admitting: Surgery

## 2014-02-28 ENCOUNTER — Encounter (INDEPENDENT_AMBULATORY_CARE_PROVIDER_SITE_OTHER): Payer: Self-pay | Admitting: Surgery

## 2014-02-28 VITALS — BP 150/78 | HR 80 | Temp 97.0°F | Resp 14 | Ht 68.0 in | Wt 191.0 lb

## 2014-02-28 DIAGNOSIS — K4090 Unilateral inguinal hernia, without obstruction or gangrene, not specified as recurrent: Secondary | ICD-10-CM | POA: Insufficient documentation

## 2014-02-28 NOTE — Progress Notes (Signed)
Patient ID: Steven Austin, male   DOB: 10/02/1946, 68 y.o.   MRN: [id number]  Chief Complaint  Patient presents with  . eval lih    HPI Steven Austin is a 68 y.o. male.   HPI This is a pleasant gentleman referred by Dr. Hulan Fess for evaluation of a left inguinal hernia. This was found On his routine screening physical examination. He remains asymptomatic from a hernia. He does not notice a bulge in his groin. His activity has been limited as he is recovering from knee replacement. He is otherwise without complaints. Past Medical History  Diagnosis Date  . Hypertension   . Hearing loss   . Hyperlipidemia   . Complication of anesthesia     "bothered by ether in the [place]"   . Left knee DJD   . Arthritis     "knees" (01/24/2014)    Past Surgical History  Procedure Laterality Date  . Femur fracture surgery Right 1959    "broke it playing football"  . Colonoscopy w/ polypectomy    . Total knee arthroplasty Left 01/24/2014  . Total knee arthroplasty Left 01/24/2014    Procedure: LEFT TOTAL KNEE ARTHROPLASTY;  Surgeon: Lorn Junes, MD;  Location: [place];  Service: Orthopedics;  Laterality: Left;    Family History  Problem Relation Age of Onset  . Cancer Mother   . Lung disease Father   . Breast cancer Sister     Social History History  Substance Use Topics  . Smoking status: Former Smoker -- 0.75 packs/day for 6 years    Types: Cigarettes    Quit date: 01/13/1972  . Smokeless tobacco: Never Used  . Alcohol Use: 8.4 oz/week    14 Shots of liquor per week     Comment: 01/24/2014 "2-3 drinks a day; mixed drinks or beer; usually mixed"    Allergies  Allergen Reactions  . Augmentin [Amoxicillin-Pot Clavulanate] Rash    Current Outpatient Prescriptions  Medication Sig Dispense Refill  . acetaminophen (TYLENOL) 325 MG tablet Take 2 tablets (650 mg total) by mouth every 6 (six) hours as needed for mild pain (or Fever >/= 101).      Marland Kitchen aspirin EC 325 MG EC tablet  1 tab a day for the next 30 days to prevent blood clots  30 tablet  0  . atorvastatin (LIPITOR) 20 MG tablet Take 20 mg by mouth daily.      . beta carotene w/minerals (OCUVITE) tablet Take 1 tablet by mouth daily.      . bisacodyl (DULCOLAX) 5 MG EC tablet Take 2 tablets every night with dinner until bowel movement.  LAXITIVE.  Restart if two days since last bowel movement  30 tablet  0  . celecoxib (CELEBREX) 200 MG capsule 1 tab po q day with food for pain and  swelling  30 capsule  0  . docusate sodium 100 MG CAPS 1 tab 2 times a day while on narcotics.  STOOL SOFTENER  60 capsule  0  . fenofibrate 160 MG tablet Take 160 mg by mouth daily.      . hydrochlorothiazide (HYDRODIURIL) 25 MG tablet Take 25 mg by mouth daily.      Marland Kitchen NIFEdipine (PROCARDIA-XL/ADALAT CC) 60 MG 24 hr tablet Take 60 mg by mouth daily.      Marland Kitchen oxyCODONE (OXY IR/ROXICODONE) 5 MG immediate release tablet 1-2 tablets every 4-6 hrs as needed for pain  100 tablet  0   No current facility-administered medications for this  visit.    Review of Systems Review of Systems  Constitutional: Negative for fever, chills and unexpected weight change.  HENT: Negative for congestion, hearing loss, sore throat, trouble swallowing and voice change.   Eyes: Negative for visual disturbance.  Respiratory: Negative for cough and wheezing.   Cardiovascular: Negative for chest pain, palpitations and leg swelling.  Gastrointestinal: Negative for nausea, vomiting, abdominal pain, diarrhea, constipation, blood in stool, abdominal distention, anal bleeding and rectal pain.  Genitourinary: Negative for hematuria and difficulty urinating.  Musculoskeletal: Positive for arthralgias and joint swelling.  Skin: Negative for rash and wound.  Neurological: Negative for seizures, syncope, weakness and headaches.  Hematological: Negative for adenopathy. Does not bruise/bleed easily.  Psychiatric/Behavioral: Negative for confusion.    Blood pressure  150/78, pulse 80, temperature 97 F (36.1 C), resp. rate 14, height 5\' 8"  (1.727 m), weight 191 lb (86.637 kg).  Physical Exam Physical Exam  Constitutional: He is oriented to person, place, and time. He appears well-developed and well-nourished. No distress.  HENT:  Head: Normocephalic and atraumatic.  Right Ear: External ear normal.  Left Ear: External ear normal.  Nose: Nose normal.  Mouth/Throat: Oropharynx is clear and moist. No oropharyngeal exudate.  Eyes: Conjunctivae are normal. Pupils are equal, round, and reactive to light. Right eye exhibits no discharge. Left eye exhibits no discharge. No scleral icterus.  Neck: Normal range of motion. Neck supple. No tracheal deviation present.  Cardiovascular: Normal rate, regular rhythm, normal heart sounds and intact distal pulses.   No murmur heard. Pulmonary/Chest: Effort normal. No respiratory distress. He has no wheezes.  Abdominal: Soft. Bowel sounds are normal. He exhibits no distension. There is no tenderness. There is no rebound.  Mild to moderate sized, easily reducible left inguinal hernia. Right inguinal laxity  Musculoskeletal: He exhibits edema.  Postoperative swelling of the left knee. His incision is healing well  Lymphadenopathy:    He has no cervical adenopathy.  Neurological: He is alert and oriented to person, place, and time.  Skin: Skin is warm and dry. No rash noted. No erythema.  Psychiatric: His behavior is normal. Judgment normal.    Data Reviewed   Assessment    Left inguinal hernia, possible right inguinal hernia     Plan    I discussed this with him in detail. As the inguinal hernia is moderate in size and he was to increase his activity, I would recommend laparoscopic left inguinal hernia and possible right inguinal hernia repair with mesh. The laparoscopic approach would help me to evaluate the right inguinal area as I do suspect he probably has a hernia there. I discussed the risks of surgery which  includes but is not limited to bleeding, infection, nerve entrapment, chronic pain, recurrence, etc. He will schedule surgery in 1-2 months after he has recovered further from the knee surgery which I feel is reasonable. I did discuss the risk of incarceration. I discussed the signs of incarceration. He will call me to schedule surgery unless he has problems and needs to see me more urgently        Harl Bowie 02/28/2014, 11:28 AM

## 2014-03-03 ENCOUNTER — Ambulatory Visit (HOSPITAL_COMMUNITY): Payer: BC Managed Care – PPO | Attending: Cardiovascular Disease | Admitting: Cardiology

## 2014-03-03 ENCOUNTER — Telehealth (INDEPENDENT_AMBULATORY_CARE_PROVIDER_SITE_OTHER): Payer: Self-pay

## 2014-03-03 ENCOUNTER — Encounter: Payer: Self-pay | Admitting: Cardiovascular Disease

## 2014-03-03 ENCOUNTER — Encounter (INDEPENDENT_AMBULATORY_CARE_PROVIDER_SITE_OTHER): Payer: Self-pay

## 2014-03-03 ENCOUNTER — Other Ambulatory Visit (HOSPITAL_COMMUNITY): Payer: Self-pay | Admitting: Cardiology

## 2014-03-03 DIAGNOSIS — M79609 Pain in unspecified limb: Secondary | ICD-10-CM

## 2014-03-03 DIAGNOSIS — M7989 Other specified soft tissue disorders: Secondary | ICD-10-CM

## 2014-03-03 NOTE — Telephone Encounter (Signed)
Pt seen by Dr Ninfa Linden in clinic on 02/28/14 and surgery was discussed. Pt called stating that he has noticed a puffy area over where his  left inguinal hernia is. Patient states that he has not had any fevers, chills, or any pain. He is leaving to go on vacation on 03/04/14 and wanted to see if he needed to be seen before he leaves.  Please advise.

## 2014-03-03 NOTE — Telephone Encounter (Signed)
Called pt to let him know Dr Ninfa Linden stated he would only need to be seen if any have any tenderness and a lot of pain. Pt states that he is not having any pain but there is a little tenderness. He stated that he would call us back if he noticed anymore tenderness or started having pain.

## 2014-03-03 NOTE — Progress Notes (Signed)
Lower venous duplex limited completed 

## 2014-03-03 NOTE — Telephone Encounter (Signed)
Needs to be seen only if he is having tenderness at that site and a lot of pain

## 2014-03-22 ENCOUNTER — Encounter (HOSPITAL_COMMUNITY): Payer: Self-pay | Admitting: Pharmacy Technician

## 2014-03-22 ENCOUNTER — Encounter (INDEPENDENT_AMBULATORY_CARE_PROVIDER_SITE_OTHER): Payer: Self-pay | Admitting: Surgery

## 2014-03-22 ENCOUNTER — Ambulatory Visit (INDEPENDENT_AMBULATORY_CARE_PROVIDER_SITE_OTHER): Payer: BC Managed Care – PPO | Admitting: Surgery

## 2014-03-22 VITALS — BP 100/70 | HR 80 | Temp 97.0°F | Resp 12 | Ht 69.0 in | Wt 196.0 lb

## 2014-03-22 DIAGNOSIS — K4090 Unilateral inguinal hernia, without obstruction or gangrene, not specified as recurrent: Secondary | ICD-10-CM

## 2014-03-22 NOTE — Progress Notes (Signed)
Subjective:     Patient ID: Steven Austin., male   DOB: Apr 23, 1946, 68 y.o.   MRN: [id number]  HPI He is here today as he is more symptomatic from the hernia on the left as far as sticking out more. He denies pain  Review of Systems     Objective:   Physical Exam His hernia on the left remains easily reducible    Assessment:     Left inguinal hernia, possible right     Plan:     Schedule for surgery

## 2014-03-23 NOTE — Pre-Procedure Instructions (Signed)
Steven Austin.  03/23/2014   Your procedure is scheduled on: Friday, Mar 25, 2014   Report to [facility] (use Main Entrance "A'') at 10:00 AM.  Call this number if you have problems the morning of surgery: [phone]   Remember:   Do not eat food or drink liquids after midnight.   Take these medicines the morning of surgery with A SIP OF WATER NIFEdipine (PROCARDIA-XL/ADALAT CC) 60 Stop taking Aspirin, vitamins and herbal medications. Do not take any NSAIDs ie: Ibuprofen, Advil, Naproxen or any medication containing Aspirin (celecoxib (CELEBREX).  Do not wear jewelry, make-up or nail polish.  Do not wear lotions, powders, or perfumes. You may wear deodorant.  Do not shave 48 hours prior to surgery. Men may shave face and neck.  Do not bring valuables to the hospital.  [facility] is not responsible for any belongings or valuables.               Contacts, dentures or bridgework may not be worn into surgery.  Leave suitcase in the car. After surgery it may be brought to your room.  For patients admitted to the hospital, discharge time is determined by your treatment team.               Patients discharged the day of surgery will not be allowed to drive home.  Name and phone number of your driver:   Special Instructions:  Special Instructions:Special Instructions: [facility] - Preparing for Surgery  Before surgery, you can play an important role.  Because skin is not sterile, your skin needs to be as free of germs as possible.  You can reduce the number of germs on you skin by washing with CHG (chlorahexidine gluconate) soap before surgery.  CHG is an antiseptic cleaner which kills germs and bonds with the skin to continue killing germs even after washing.  Please DO NOT use if you have an allergy to CHG or antibacterial soaps.  If your skin becomes reddened/irritated stop using the CHG and inform your nurse when you arrive at Short Stay.  Do not shave (including legs and  underarms) for at least 48 hours prior to the first CHG shower.  You may shave your face.  Please follow these instructions carefully:   1.  Shower with CHG Soap the night before surgery and the morning of Surgery.  2.  If you choose to wash your hair, wash your hair first as usual with your normal shampoo.  3.  After you shampoo, rinse your hair and body thoroughly to remove the Shampoo.  4.  Use CHG as you would any other liquid soap.  You can apply chg directly  to the skin and wash gently with scrungie or a clean washcloth.  5.  Apply the CHG Soap to your body ONLY FROM THE NECK DOWN.  Do not use on open wounds or open sores.  Avoid contact with your eyes, ears, mouth and genitals (private parts).  Wash genitals (private parts) with your normal soap.  6.  Wash thoroughly, paying special attention to the area where your surgery will be performed.  7.  Thoroughly rinse your body with warm water from the neck down.  8.  DO NOT shower/wash with your normal soap after using and rinsing off the CHG Soap.  9.  Pat yourself dry with a clean towel.            10.  Wear clean pajamas.  11.  Place clean sheets on your bed the night of your first shower and do not sleep with pets.  Day of Surgery  Do not apply any lotions the morning of surgery.  Please wear clean clothes to the hospital/surgery center.   Please read over the following fact sheets that you were given: Pain Booklet, Coughing and Deep Breathing and Surgical Site Infection Prevention

## 2014-03-24 ENCOUNTER — Encounter (HOSPITAL_COMMUNITY): Payer: Self-pay

## 2014-03-24 ENCOUNTER — Encounter (HOSPITAL_COMMUNITY)
Admission: RE | Admit: 2014-03-24 | Discharge: 2014-03-24 | Disposition: A | Discharge status: Home / Self Care | Payer: BC Managed Care – PPO | Source: Ambulatory Visit | Attending: Orthopaedic Surgery | Admitting: Orthopaedic Surgery

## 2014-03-24 LAB — BASIC METABOLIC PANEL
BUN: 16 mg/dL (ref 6–23)
CALCIUM: 10.2 mg/dL (ref 8.4–10.5)
CO2: 23 mEq/L (ref 19–32)
Chloride: 106 mEq/L (ref 96–112)
Creatinine, Ser: 0.9 mg/dL (ref 0.50–1.35)
GFR, EST NON AFRICAN AMERICAN: 86 mL/min — AB (ref >90)
Glucose, Bld: 121 mg/dL — ABNORMAL HIGH (ref 70–99)
Potassium: 3.6 mEq/L — ABNORMAL LOW (ref 3.7–5.3)
Sodium: 145 mEq/L (ref 137–147)

## 2014-03-24 LAB — CBC
HCT: 46.3 % (ref 39.0–52.0)
Hemoglobin: 15.5 g/dL (ref 13.0–17.0)
MCH: 31.5 pg (ref 26.0–34.0)
MCHC: 33.5 g/dL (ref 30.0–36.0)
MCV: 94.1 fL (ref 78.0–100.0)
Platelets: 226 10*3/uL (ref 150–400)
RBC: 4.92 MIL/uL (ref 4.22–5.81)
RDW: 13.3 % (ref 11.5–15.5)
WBC: 6 10*3/uL (ref 4.0–10.5)

## 2014-03-24 MED ORDER — CEFAZOLIN SODIUM-DEXTROSE 2-3 GM-% IV SOLR
2.0000 g | INTRAVENOUS | Status: AC
  Administered 2014-03-25: 2 g via INTRAVENOUS
  Filled 2014-03-24: qty 50

## 2014-03-24 NOTE — Progress Notes (Signed)
Anesthesia Chart Review: Patient is a 68 year old male scheduled for laparoscopic bilateral IHR on 03/25/14 by Dr. Coralie Keens.  History includes HTN, former smoker, HLD, high frequency hearing loss, DJD, colon surgery (polyps removed), left TKR 01/24/14. Impaired fasting glucose by PCP notes. BMI 28.89. PCP is Dr. Hulan Fess. OSA screening score was 4.   EKG on 01/17/14 showed NSR, LAFB, poor r wave progression. There are no comparison EKGs in Epic, Muse, or at Dr. Eddie Dibbles office. He denied prior stress, echo, or cath. No CV symptoms reported at PAT. No known MI or CHF history. He has since tolerated TKR.  CXR on 01/17/14 showed: No acute cardiopulmonary disease.  Preoperative labs noted.   If no acute changes then I would anticipate that he could proceed as planned.  [facility] Short Stay Center/Anesthesiology Phone [phone] 03/24/2014 10:25 AM

## 2014-03-24 NOTE — Progress Notes (Signed)
03/24/14 0820  OBSTRUCTIVE SLEEP APNEA  Have you ever been diagnosed with sleep apnea through a sleep study? No  Do you snore loudly (loud enough to be heard through closed doors)?  0  Do you often feel tired, fatigued, or sleepy during the daytime? 0  Has anyone observed you stop breathing during your sleep? 0  Do you have, or are you being treated for high blood pressure? 1  BMI more than 35 kg/m2? 0  Age over 68 years old? 1  Neck circumference greater than 40 cm/16 inches? 1  Gender: 1  Obstructive Sleep Apnea Score 4

## 2014-03-24 NOTE — H&P (Signed)
Patient ID: Steven Austin, male DOB: 19-Jun-1946, 68 y.o. MRN: [id number]  Chief Complaint   Patient presents with   .  eval lih   HPI  Steven Austin is a 68 y.o. male.  HPI  This is a pleasant gentleman referred by Dr. Hulan Fess for evaluation of a left inguinal hernia. This was found On his routine screening physical examination. He remains asymptomatic from a hernia. He does not notice a bulge in his groin. His activity has been limited as he is recovering from knee replacement. He is otherwise without complaints.  Past Medical History   Diagnosis  Date   .  Hypertension    .  Hearing loss    .  Hyperlipidemia    .  Complication of anesthesia      "bothered by ether in the [place]"   .  Left knee DJD    .  Arthritis      "knees" (01/24/2014)    Past Surgical History   Procedure  Laterality  Date   .  Femur fracture surgery  Right  1959     "broke it playing football"   .  Colonoscopy w/ polypectomy     .  Total knee arthroplasty  Left  01/24/2014   .  Total knee arthroplasty  Left  01/24/2014     Procedure: LEFT TOTAL KNEE ARTHROPLASTY; Surgeon: Lorn Junes, MD; Location: [place]; Service: Orthopedics; Laterality: Left;    Family History   Problem  Relation  Age of Onset   .  Cancer  Mother    .  Lung disease  Father    .  Breast cancer  Sister    Social History  History   Substance Use Topics   .  Smoking status:  Former Smoker -- 0.75 packs/day for 6 years     Types:  Cigarettes     Quit date:  01/13/1972   .  Smokeless tobacco:  Never Used   .  Alcohol Use:  8.4 oz/week     14 Shots of liquor per week      Comment: 01/24/2014 "2-3 drinks a day; mixed drinks or beer; usually mixed"    Allergies   Allergen  Reactions   .  Augmentin [Amoxicillin-Pot Clavulanate]  Rash    Current Outpatient Prescriptions   Medication  Sig  Dispense  Refill   .  acetaminophen (TYLENOL) 325 MG tablet  Take 2 tablets (650 mg total) by mouth every 6 (six) hours as needed for  mild pain (or Fever >/= 101).     Marland Kitchen  aspirin EC 325 MG EC tablet  1 tab a day for the next 30 days to prevent blood clots  30 tablet  0   .  atorvastatin (LIPITOR) 20 MG tablet  Take 20 mg by mouth daily.     .  beta carotene w/minerals (OCUVITE) tablet  Take 1 tablet by mouth daily.     .  bisacodyl (DULCOLAX) 5 MG EC tablet  Take 2 tablets every night with dinner until bowel movement. LAXITIVE. Restart if two days since last bowel movement  30 tablet  0   .  celecoxib (CELEBREX) 200 MG capsule  1 tab po q day with food for pain and swelling  30 capsule  0   .  docusate sodium 100 MG CAPS  1 tab 2 times a day while on narcotics. STOOL SOFTENER  60 capsule  0   .  fenofibrate 160 MG  tablet  Take 160 mg by mouth daily.     .  hydrochlorothiazide (HYDRODIURIL) 25 MG tablet  Take 25 mg by mouth daily.     Marland Kitchen  NIFEdipine (PROCARDIA-XL/ADALAT CC) 60 MG 24 hr tablet  Take 60 mg by mouth daily.     Marland Kitchen  oxyCODONE (OXY IR/ROXICODONE) 5 MG immediate release tablet  1-2 tablets every 4-6 hrs as needed for pain  100 tablet  0    No current facility-administered medications for this visit.   Review of Systems  Review of Systems  Constitutional: Negative for fever, chills and unexpected weight change.  HENT: Negative for congestion, hearing loss, sore throat, trouble swallowing and voice change.  Eyes: Negative for visual disturbance.  Respiratory: Negative for cough and wheezing.  Cardiovascular: Negative for chest pain, palpitations and leg swelling.  Gastrointestinal: Negative for nausea, vomiting, abdominal pain, diarrhea, constipation, blood in stool, abdominal distention, anal bleeding and rectal pain.  Genitourinary: Negative for hematuria and difficulty urinating.  Musculoskeletal: Positive for arthralgias and joint swelling.  Skin: Negative for rash and wound.  Neurological: Negative for seizures, syncope, weakness and headaches.  Hematological: Negative for adenopathy. Does not bruise/bleed  easily.  Psychiatric/Behavioral: Negative for confusion.  Blood pressure 150/78, pulse 80, temperature 97 F (36.1 C), resp. rate 14, height 5\' 8"  (1.727 m), weight 191 lb (86.637 kg).  Physical Exam  Physical Exam  Constitutional: He is oriented to person, place, and time. He appears well-developed and well-nourished. No distress.  HENT:  Head: Normocephalic and atraumatic.  Right Ear: External ear normal.  Left Ear: External ear normal.  Nose: Nose normal.  Mouth/Throat: Oropharynx is clear and moist. No oropharyngeal exudate.  Eyes: Conjunctivae are normal. Pupils are equal, round, and reactive to light. Right eye exhibits no discharge. Left eye exhibits no discharge. No scleral icterus.  Neck: Normal range of motion. Neck supple. No tracheal deviation present.  Cardiovascular: Normal rate, regular rhythm, normal heart sounds and intact distal pulses.  No murmur heard.  Pulmonary/Chest: Effort normal. No respiratory distress. He has no wheezes.  Abdominal: Soft. Bowel sounds are normal. He exhibits no distension. There is no tenderness. There is no rebound.  Mild to moderate sized, easily reducible left inguinal hernia. Right inguinal laxity  Musculoskeletal: He exhibits edema.  Postoperative swelling of the left knee. His incision is healing well  Lymphadenopathy:  He has no cervical adenopathy.  Neurological: He is alert and oriented to person, place, and time.  Skin: Skin is warm and dry. No rash noted. No erythema.  Psychiatric: His behavior is normal. Judgment normal.   Assessment  Left inguinal hernia, possible right inguinal hernia   Plan  I discussed this with him in detail. As the inguinal hernia is moderate in size and he was to increase his activity, I would recommend laparoscopic left inguinal hernia and possible right inguinal hernia repair with mesh (ie bilateral inguinal hernia repair with mesh). The laparoscopic approach would help me to evaluate the right inguinal  area as I do suspect he probably has a hernia there. I discussed the risks of surgery which includes but is not limited to bleeding, infection, nerve entrapment, chronic pain, recurrence, etc. He will schedule surgery in 1-2 months after he has recovered further from the knee surgery which I feel is reasonable. I did discuss the risk of incarceration. I discussed the signs of incarceration. He will call me to schedule surgery unless he has problems and needs to see me more urgently

## 2014-03-25 ENCOUNTER — Ambulatory Visit (HOSPITAL_COMMUNITY): Payer: BC Managed Care – PPO | Admitting: Certified Registered"

## 2014-03-25 ENCOUNTER — Encounter (HOSPITAL_COMMUNITY): Payer: BC Managed Care – PPO | Admitting: Vascular Surgery

## 2014-03-25 ENCOUNTER — Encounter (HOSPITAL_COMMUNITY)
Admission: RE | Disposition: A | Discharge status: Home / Self Care | Payer: Self-pay | Source: Ambulatory Visit | Attending: Surgery

## 2014-03-25 ENCOUNTER — Ambulatory Visit (HOSPITAL_COMMUNITY)
Admission: RE | Admit: 2014-03-25 | Discharge: 2014-03-25 | Disposition: A | Discharge status: Home / Self Care | Payer: BC Managed Care – PPO | Source: Ambulatory Visit | Attending: Surgery | Admitting: Surgery

## 2014-03-25 ENCOUNTER — Encounter (HOSPITAL_COMMUNITY): Payer: Self-pay | Admitting: *Deleted

## 2014-03-25 DIAGNOSIS — K4020 Bilateral inguinal hernia, without obstruction or gangrene, not specified as recurrent: Secondary | ICD-10-CM

## 2014-03-25 DIAGNOSIS — Z7982 Long term (current) use of aspirin: Secondary | ICD-10-CM | POA: Insufficient documentation

## 2014-03-25 DIAGNOSIS — Z79899 Other long term (current) drug therapy: Secondary | ICD-10-CM | POA: Insufficient documentation

## 2014-03-25 DIAGNOSIS — I10 Essential (primary) hypertension: Secondary | ICD-10-CM | POA: Insufficient documentation

## 2014-03-25 DIAGNOSIS — IMO0002 Reserved for concepts with insufficient information to code with codable children: Secondary | ICD-10-CM | POA: Insufficient documentation

## 2014-03-25 DIAGNOSIS — E785 Hyperlipidemia, unspecified: Secondary | ICD-10-CM | POA: Insufficient documentation

## 2014-03-25 DIAGNOSIS — M1710 Unilateral primary osteoarthritis, unspecified knee: Secondary | ICD-10-CM | POA: Insufficient documentation

## 2014-03-25 DIAGNOSIS — Z880 Allergy status to penicillin: Secondary | ICD-10-CM | POA: Insufficient documentation

## 2014-03-25 DIAGNOSIS — Z87891 Personal history of nicotine dependence: Secondary | ICD-10-CM | POA: Insufficient documentation

## 2014-03-25 HISTORY — PX: INSERTION OF MESH: SHX5868

## 2014-03-25 HISTORY — PX: INGUINAL HERNIA REPAIR: SHX194

## 2014-03-25 SURGERY — REPAIR, HERNIA, INGUINAL, BILATERAL, LAPAROSCOPIC
Anesthesia: General | Site: Groin | Laterality: Bilateral

## 2014-03-25 MED ORDER — LACTATED RINGERS IV SOLN
INTRAVENOUS | Status: DC
  Administered 2014-03-25 (×2): via INTRAVENOUS

## 2014-03-25 MED ORDER — FENTANYL CITRATE 0.05 MG/ML IJ SOLN
INTRAMUSCULAR | Status: AC
  Filled 2014-03-25: qty 5

## 2014-03-25 MED ORDER — OXYCODONE HCL 5 MG/5ML PO SOLN: 5.0000 mg | Freq: Once | ORAL | Status: AC | PRN

## 2014-03-25 MED ORDER — NEOSTIGMINE METHYLSULFATE 10 MG/10ML IV SOLN
INTRAVENOUS | Status: DC | PRN
  Administered 2014-03-25: 3 mg via INTRAVENOUS

## 2014-03-25 MED ORDER — OXYCODONE HCL 5 MG PO TABS
ORAL_TABLET | ORAL | Status: AC
  Filled 2014-03-25: qty 1

## 2014-03-25 MED ORDER — BUPIVACAINE HCL (PF) 0.25 % IJ SOLN
INTRAMUSCULAR | Status: DC | PRN
  Administered 2014-03-25: 30 mL

## 2014-03-25 MED ORDER — OXYCODONE HCL 5 MG PO TABS: 5.0000 mg | ORAL_TABLET | ORAL | Status: DC | PRN

## 2014-03-25 MED ORDER — FENTANYL CITRATE 0.05 MG/ML IJ SOLN
INTRAMUSCULAR | Status: DC | PRN
  Administered 2014-03-25: 50 ug via INTRAVENOUS
  Administered 2014-03-25: 100 ug via INTRAVENOUS

## 2014-03-25 MED ORDER — SODIUM CHLORIDE 0.9 % IR SOLN
Status: DC | PRN
  Administered 2014-03-25: 1000 mL

## 2014-03-25 MED ORDER — LIDOCAINE HCL (CARDIAC) 20 MG/ML IV SOLN
INTRAVENOUS | Status: DC | PRN
  Administered 2014-03-25: 100 mg via INTRAVENOUS

## 2014-03-25 MED ORDER — SUCCINYLCHOLINE CHLORIDE 20 MG/ML IJ SOLN
INTRAMUSCULAR | Status: DC | PRN
  Administered 2014-03-25: 120 mg via INTRAVENOUS

## 2014-03-25 MED ORDER — 0.9 % SODIUM CHLORIDE (POUR BTL) OPTIME
TOPICAL | Status: DC | PRN
  Administered 2014-03-25: 1000 mL

## 2014-03-25 MED ORDER — ONDANSETRON HCL 4 MG/2ML IJ SOLN
INTRAMUSCULAR | Status: DC | PRN
  Administered 2014-03-25: 4 mg via INTRAVENOUS

## 2014-03-25 MED ORDER — PROPOFOL 10 MG/ML IV BOLUS
INTRAVENOUS | Status: DC | PRN
  Administered 2014-03-25: 30 mg via INTRAVENOUS
  Administered 2014-03-25: 170 mg via INTRAVENOUS

## 2014-03-25 MED ORDER — PROPOFOL 10 MG/ML IV BOLUS
INTRAVENOUS | Status: AC
  Filled 2014-03-25: qty 20

## 2014-03-25 MED ORDER — ROCURONIUM BROMIDE 100 MG/10ML IV SOLN
INTRAVENOUS | Status: DC | PRN
  Administered 2014-03-25: 20 mg via INTRAVENOUS

## 2014-03-25 MED ORDER — BUPIVACAINE HCL (PF) 0.25 % IJ SOLN
INTRAMUSCULAR | Status: AC
  Filled 2014-03-25: qty 30

## 2014-03-25 MED ORDER — KETOROLAC TROMETHAMINE 30 MG/ML IJ SOLN
INTRAMUSCULAR | Status: DC | PRN
  Administered 2014-03-25: 30 mg via INTRAVENOUS

## 2014-03-25 MED ORDER — MIDAZOLAM HCL 5 MG/5ML IJ SOLN
INTRAMUSCULAR | Status: DC | PRN
  Administered 2014-03-25: 2 mg via INTRAVENOUS

## 2014-03-25 MED ORDER — OXYCODONE HCL 5 MG PO TABS
5.0000 mg | ORAL_TABLET | Freq: Once | ORAL | Status: AC | PRN
  Administered 2014-03-25: 5 mg via ORAL

## 2014-03-25 MED ORDER — MIDAZOLAM HCL 2 MG/2ML IJ SOLN
INTRAMUSCULAR | Status: AC
  Filled 2014-03-25: qty 2

## 2014-03-25 MED ORDER — PROMETHAZINE HCL 25 MG/ML IJ SOLN: 6.2500 mg | INTRAMUSCULAR | Status: DC | PRN

## 2014-03-25 MED ORDER — HYDROMORPHONE HCL PF 1 MG/ML IJ SOLN
INTRAMUSCULAR | Status: AC
  Filled 2014-03-25: qty 1

## 2014-03-25 MED ORDER — GLYCOPYRROLATE 0.2 MG/ML IJ SOLN
INTRAMUSCULAR | Status: DC | PRN
  Administered 2014-03-25: 0.4 mg via INTRAVENOUS

## 2014-03-25 MED ORDER — HYDROMORPHONE HCL PF 1 MG/ML IJ SOLN
0.2500 mg | INTRAMUSCULAR | Status: DC | PRN
  Administered 2014-03-25 (×2): 0.5 mg via INTRAVENOUS

## 2014-03-25 SURGICAL SUPPLY — 36 items
APPLIER CLIP LOGIC TI 5 (MISCELLANEOUS) IMPLANT
BANDAGE ADHESIVE 1X3 (GAUZE/BANDAGES/DRESSINGS) ×9 IMPLANT
BENZOIN TINCTURE PRP APPL 2/3 (GAUZE/BANDAGES/DRESSINGS) ×3 IMPLANT
BLADE SURG ROTATE 9660 (MISCELLANEOUS) ×3 IMPLANT
CANISTER SUCTION 2500CC (MISCELLANEOUS) ×3 IMPLANT
CHLORAPREP W/TINT 26ML (MISCELLANEOUS) ×3 IMPLANT
COVER SURGICAL LIGHT HANDLE (MISCELLANEOUS) ×3 IMPLANT
DEVICE SECURE STRAP 25 ABSORB (INSTRUMENTS) ×3 IMPLANT
DISSECT BALLN SPACEMKR + OVL (BALLOONS) ×3
DISSECTOR BALLN SPACEMKR + OVL (BALLOONS) ×1 IMPLANT
DISSECTOR BLUNT TIP ENDO 5MM (MISCELLANEOUS) IMPLANT
DRAPE UTILITY 15X26 W/TAPE STR (DRAPE) ×6 IMPLANT
ELECT REM PT RETURN 9FT ADLT (ELECTROSURGICAL) ×3
ELECTRODE REM PT RTRN 9FT ADLT (ELECTROSURGICAL) ×1 IMPLANT
GLOVE BIO SURGEON STRL SZ7 (GLOVE) ×3 IMPLANT
GLOVE BIOGEL PI IND STRL 7.0 (GLOVE) ×1 IMPLANT
GLOVE BIOGEL PI INDICATOR 7.0 (GLOVE) ×2
GLOVE SURG SIGNA 7.5 PF LTX (GLOVE) ×3 IMPLANT
GOWN STRL REUS W/ TWL LRG LVL3 (GOWN DISPOSABLE) ×2 IMPLANT
GOWN STRL REUS W/ TWL XL LVL3 (GOWN DISPOSABLE) ×1 IMPLANT
GOWN STRL REUS W/TWL LRG LVL3 (GOWN DISPOSABLE) ×4
GOWN STRL REUS W/TWL XL LVL3 (GOWN DISPOSABLE) ×2
KIT BASIN OR (CUSTOM PROCEDURE TRAY) ×3 IMPLANT
KIT ROOM TURNOVER OR (KITS) ×3 IMPLANT
MESH 3DMAX LIGHT 4.1X6.2 LT LR (Mesh General) ×3 IMPLANT
MESH 3DMAX LIGHT 4.1X6.2 RT LR (Mesh General) ×3 IMPLANT
NEEDLE INSUFFLATION 14GA 120MM (NEEDLE) IMPLANT
NS IRRIG 1000ML POUR BTL (IV SOLUTION) ×3 IMPLANT
PAD ARMBOARD 7.5X6 YLW CONV (MISCELLANEOUS) ×3 IMPLANT
SET IRRIG TUBING LAPAROSCOPIC (IRRIGATION / IRRIGATOR) ×3 IMPLANT
SET TROCAR LAP APPLE-HUNT 5MM (ENDOMECHANICALS) ×3 IMPLANT
SUT MNCRL AB 4-0 PS2 18 (SUTURE) ×3 IMPLANT
TOWEL OR 17X24 6PK STRL BLUE (TOWEL DISPOSABLE) ×3 IMPLANT
TOWEL OR 17X26 10 PK STRL BLUE (TOWEL DISPOSABLE) ×3 IMPLANT
TRAY FOLEY CATH 16FRSI W/METER (SET/KITS/TRAYS/PACK) ×3 IMPLANT
TRAY LAPAROSCOPIC (CUSTOM PROCEDURE TRAY) ×3 IMPLANT

## 2014-03-25 NOTE — Interval H&P Note (Signed)
History and Physical Interval Note: no change in H and P  03/25/2014 12:23 PM  Steven Austin.  has presented today for surgery, with the diagnosis of left inguinal hernia possible right inguinal hernia  The various methods of treatment have been discussed with the patient and family. After consideration of risks, benefits and other options for treatment, the patient has consented to  Procedure(s): [place] (Bilateral) INSERTION OF MESH (Bilateral) as a surgical intervention .  The patient's history has been reviewed, patient examined, no change in status, stable for surgery.  I have reviewed the patient's chart and labs.  Questions were answered to the patient's satisfaction.     Harl Bowie

## 2014-03-25 NOTE — Discharge Instructions (Signed)
[facility] ______[facility] LAPAROSCOPIC SURGERY: POST OP INSTRUCTIONS Always review your discharge instruction sheet given to you by the facility where your surgery was performed. IF YOU HAVE DISABILITY OR FAMILY LEAVE FORMS, YOU MUST BRING THEM TO THE OFFICE FOR PROCESSING.   DO NOT GIVE THEM TO YOUR DOCTOR.  1. A prescription for pain medication may be given to you upon discharge.  Take your pain medication as prescribed, if needed.  If narcotic pain medicine is not needed, then you may take acetaminophen (Tylenol) or ibuprofen (Advil) as needed. 2. Take your usually prescribed medications unless otherwise directed. 3. If you need a refill on your pain medication, please contact your pharmacy.  They will contact our office to request authorization. Prescriptions will not be filled after 5pm or on week-ends. 4. You should follow a light diet the first few days after arrival home, such as soup and crackers, etc.  Be sure to include lots of fluids daily. 5. Most patients will experience some swelling and bruising in the area of the incisions.  Ice packs will help.  Swelling and bruising can take several days to resolve.  6. It is common to experience some constipation if taking pain medication after surgery.  Increasing fluid intake and taking a stool softener (such as Colace) will usually help or prevent this problem from occurring.  A mild laxative (Milk of Magnesia or Miralax) should be taken according to package instructions if there are no bowel movements after 48 hours. 7. Unless discharge instructions indicate otherwise, you may remove your bandages 24-48 hours after surgery, and you may shower at that time.  You may have steri-strips (small skin tapes) in place directly over the incision.  These strips should be left on the skin for 7-10 days.  If your surgeon used skin glue on the incision, you may shower in 24 hours.  The glue will flake off over the next 2-3 weeks.  Any sutures or  staples will be removed at the office during your follow-up visit. 8. ACTIVITIES:  You may resume regular (light) daily activities beginning the next day--such as daily self-care, walking, climbing stairs--gradually increasing activities as tolerated.  You may have sexual intercourse when it is comfortable.  Refrain from any heavy lifting or straining until approved by your doctor. a. You may drive when you are no longer taking prescription pain medication, you can comfortably wear a seatbelt, and you can safely maneuver your car and apply brakes. b. RETURN TO WORK:  __________________________________________________________ 9. You should see your doctor in the office for a follow-up appointment approximately 2-3 weeks after your surgery.  Make sure that you call for this appointment within a day or two after you arrive home to insure a convenient appointment time. 10. OTHER INSTRUCTIONS: ___ICE PACK ALSO FOR PAIN. 11. NO LIFTING MORE THAN 15 POUNDS FOR 3 WEEKS. 12. _______________________________________________________________________________________________________________________ __________________________________________________________________________________________________________________________ WHEN TO CALL YOUR DOCTOR: 1. Fever over 101.0 2. Inability to urinate 3. Continued bleeding from incision. 4. Increased pain, redness, or drainage from the incision. 5. Increasing abdominal pain  The clinic staff is available to answer your questions during regular business hours.  Please dont hesitate to call and ask to speak to one of the nurses for clinical concerns.  If you have a medical emergency, go to the nearest emergency room or call 911.  A surgeon from [facility] is always on call at the hospital. [address] [place], [place], [place]  [address] ? [address] [place], [place]   [address] [phone] ? [phone] ? FAX [phone] Web site:  [email or web address]

## 2014-03-25 NOTE — Transfer of Care (Signed)
Immediate Anesthesia Transfer of Care Note  Patient: Steven Austin.  Procedure(s) Performed: Procedure(s): LAPAROSCOPIC BILATERAL INGUINAL HERNIA REPAIR (Bilateral) INSERTION OF MESH (Bilateral)  Patient Location: PACU  Anesthesia Type:General  Level of Consciousness: awake, alert  and oriented  Airway & Oxygen Therapy: Patient Spontanous Breathing and Patient connected to nasal cannula oxygen  Post-op Assessment: Report given to PACU RN and Post -op Vital signs reviewed and stable  Post vital signs: Reviewed and stable  Complications: No apparent anesthesia complications

## 2014-03-25 NOTE — Anesthesia Postprocedure Evaluation (Signed)
  Anesthesia Post-op Note  Patient: Steven Austin.  Procedure(s) Performed: Procedure(s): LAPAROSCOPIC BILATERAL INGUINAL HERNIA REPAIR (Bilateral) INSERTION OF MESH (Bilateral)  Patient Location: PACU  Anesthesia Type:General  Level of Consciousness: awake, alert  and oriented  Airway and Oxygen Therapy: Patient Spontanous Breathing and Patient connected to nasal cannula oxygen  Post-op Pain: none  Post-op Assessment: Post-op Vital signs reviewed, Patient's Cardiovascular Status Stable, Respiratory Function Stable, Patent Airway and No signs of Nausea or vomiting  Post-op Vital Signs: Reviewed and stable  Last Vitals:  Filed Vitals:   03/25/14 1347  BP:   Pulse:   Temp: 36.8 C  Resp:     Complications: No apparent anesthesia complications

## 2014-03-25 NOTE — Anesthesia Preprocedure Evaluation (Signed)
Anesthesia Evaluation  Patient identified by MRN, date of birth, ID band Patient awake    Reviewed: Allergy & Precautions, H&P , NPO status , Patient's Chart, lab work & pertinent test results  History of Anesthesia Complications Negative for: history of anesthetic complications  Airway Mallampati: II TM Distance: >3 FB Neck ROM: Full    Dental  (+) Teeth Intact, Dental Advisory Given,    Pulmonary neg pulmonary ROS, former smoker,  01-17-14 Chest x-ray IMPRESSION: No acute cardiopulmonary disease   Pulmonary exam normal       Cardiovascular Exercise Tolerance: Good hypertension, Pt. on medications  17-Jan-2014 Normal sinus rhythm Left anterior fascicular block Poor R wave progression Abnormal ECG Artifact No old tracing to compare   Neuro/Psych Anxiety negative neurological ROS  negative psych ROS   GI/Hepatic negative GI ROS, Neg liver ROS,   Endo/Other  negative endocrine ROS  Renal/GU negative Renal ROS     Musculoskeletal  (+) Arthritis -, Osteoarthritis,    Abdominal Normal abdominal exam  (+)   Peds  Hematology negative hematology ROS (+)   Anesthesia Other Findings Filling in upper left central incisor  Reproductive/Obstetrics                           Anesthesia Physical  Anesthesia Plan  ASA: II  Anesthesia Plan: General   Post-op Pain Management:    Induction: Intravenous  Airway Management Planned: Oral ETT  Additional Equipment:   Intra-op Plan:   Post-operative Plan: Extubation in OR  Informed Consent: I have reviewed the patients History and Physical, chart, labs and discussed the procedure including the risks, benefits and alternatives for the proposed anesthesia with the patient or authorized representative who has indicated his/her understanding and acceptance.   Dental advisory given  Plan Discussed with: CRNA, Anesthesiologist and  Surgeon  Anesthesia Plan Comments:         Anesthesia Quick Evaluation

## 2014-03-25 NOTE — Anesthesia Procedure Notes (Signed)
Procedure Name: Intubation Date/Time: 03/25/2014 12:53 PM Performed by: Manuela Schwartz B Pre-anesthesia Checklist: Patient identified, Emergency Drugs available, Suction available, Patient being monitored and Timeout performed Patient Re-evaluated:Patient Re-evaluated prior to inductionOxygen Delivery Method: Circle system utilized Preoxygenation: Pre-oxygenation with 100% oxygen Intubation Type: IV induction and Rapid sequence Laryngoscope Size: Mac and 3 Grade View: Grade I Tube type: Oral Tube size: 7.5 mm Number of attempts: 1 Airway Equipment and Method: Stylet Placement Confirmation: ETT inserted through vocal cords under direct vision,  positive ETCO2 and breath sounds checked- equal and bilateral Secured at: 22 cm Tube secured with: Tape Dental Injury: Teeth and Oropharynx as per pre-operative assessment

## 2014-03-25 NOTE — Op Note (Signed)
LAPAROSCOPIC BILATERAL INGUINAL HERNIA REPAIR, INSERTION OF MESH  Procedure Note  Cree Kunert. 03/25/2014   Pre-op Diagnosis: left inguinal hernia possible right inguinal hernia     Post-op Diagnosis: same  Procedure(s): LAPAROSCOPIC BILATERAL INGUINAL HERNIA REPAIR INSERTION OF MESH  Surgeon(s): Harl Bowie, MD  Anesthesia: General  Staff:  Circulator: Cottie Banda, RN Scrub Person: Leslie Andrea, CST; Tomma Rakers Sipsis, RN  Estimated Blood Loss: Minimal           Harl Bowie   Date: 03/25/2014  Time: 1:41 PM

## 2014-03-26 NOTE — Op Note (Signed)
NAME:  Steven Austin, Steven Austin NO.:  [id number]  MEDICAL RECORD NO.:  [id number]  LOCATION:  [facility]                         FACILITY:  [facility]  PHYSICIAN:  Coralie Keens, M.D. DATE OF BIRTH:  1946-07-31  DATE OF PROCEDURE:  03/25/2014 DATE OF DISCHARGE:                              OPERATIVE REPORT   PREOPERATIVE DIAGNOSIS:  Left inguinal hernia.  POSTOPERATIVE DIAGNOSIS:  Bilateral inguinal hernia.  PROCEDURE:  Bilateral laparoscopic inguinal hernia repair with mesh.  SURGEON:  Coralie Keens, M.D.  ANESTHESIA:  General endotracheal anesthesia and 0.5% Marcaine.  ESTIMATED BLOOD LOSS:  Minimal.  INDICATIONS:  This is a 68 year old gentleman who has a palpable left inguinal hernia.  He has had weakness of the right inguinal floor. Decision was made to proceed with laparoscopic repair with mesh.  FINDINGS:  The patient was found to have bilateral direct inguinal hernias, which were repaired with 2 separate pieces of Bard 3-D Max Prolene mesh.  PROCEDURE IN DETAIL:  The patient was brought to the operating room, identified as Steven Austin.  He was placed supine on the operating table where general anesthesia was induced.  A Foley catheter was then inserted.  His abdomen was then prepped and draped in the usual sterile fashion.  I made a small transverse incision just below the umbilicus. I took this down to the fascia, which I opened just to the right of the midline.  The rectus muscle was then manipulated and elevated.  I passed the dissecting balloon underneath the rectus sheath and manipulated it towards the pubis.  The dissecting balloon was then insufflated under direct vision dissecting out the preperitoneal space.  The dissecting balloon was then removed and insufflation was begun with carbon dioxide. I then placed two 5 mm ports in the patient's lower midline both under direct vision.  The right inguinal area was dissected out first.  The patient had  an obvious right direct inguinal hernia defect.  I evaluated the cord structures and found no evidence of indirect hernia.  The hernia was already reduced.  I then turned my attention to the left inguinal area and found a larger direct hernia defect here.  Scar tissue to the hernia sac was then excised bluntly.  I then dissected out the left testicular cord and found no evidence of indirect hernia.  Next, a piece of left-sided Bard 3-D Max Prolene mesh was brought into the field.  I placed it through the camera port and placed it only on the left inguinal floor.  I then tacked it to Cooper's ligament up the medial abdominal wall and slightly laterally with the absorbable tacker. Next, a piece of right-sided Bard Prolene large 3-D Max mesh was brought into the field.  I placed it through the umbilical port as well, and placed it as an onlay on the right inguinal floor.  I then tacked Cooper's ligament up the medial abdominal wall out laterally.  Wide coverage of both direct hernia defect and the cord structures appeared to be achieved.  Hemostasis also appeared to be achieved.  I then removed the midline ports and saw the preperitoneal space collapsed appropriately.  The port of the umbilicus was then removed.  The  patient did have a small fascial defect at the umbilicus, which I closed with a figure-of-eight 0-Vicryl suture.  I then closed the small fascial incision with a figure-of-eight 0-Vicryl suture as well.  I anesthetized all incisions with Marcaine.  I then performed bilateral ilioinguinal nerve blocks with Marcaine as well.  All skin incisions were then closed with 4-0 Monocryl sutures.  Steri-Strips and Band-Aids were then applied.  The patient tolerated the procedure well.  All sponge, needle, and instrument counts were correct at the end of procedure.  The patient's catheter was then removed.  He was then extubated in the operating room and taken in a stable condition to the  recovery room.     Coralie Keens, M.D.     DB/MEDQ  D:  03/25/2014  T:  03/26/2014  Job:  [id number]

## 2014-03-29 ENCOUNTER — Encounter (HOSPITAL_COMMUNITY): Payer: Self-pay | Admitting: Surgery

## 2014-04-15 ENCOUNTER — Ambulatory Visit (INDEPENDENT_AMBULATORY_CARE_PROVIDER_SITE_OTHER): Payer: BC Managed Care – PPO | Admitting: Surgery

## 2014-04-15 ENCOUNTER — Encounter (INDEPENDENT_AMBULATORY_CARE_PROVIDER_SITE_OTHER): Payer: Self-pay | Admitting: Surgery

## 2014-04-15 VITALS — BP 124/72 | HR 90 | Temp 98.0°F | Resp 18 | Ht 69.0 in | Wt 196.0 lb

## 2014-04-15 DIAGNOSIS — Z09 Encounter for follow-up examination after completed treatment for conditions other than malignant neoplasm: Secondary | ICD-10-CM

## 2014-04-15 NOTE — Progress Notes (Signed)
Subjective:     Patient ID: Steven Austin., male   DOB: 10/31/1946, 68 y.o.   MRN: [id number]  HPI He is here for his first postoperative visit status post bilateral laparoscopic inguinal hernia mesh. He is doing well and has no complaints  Review of Systems     Objective:   Physical Exam On exam, his incisions are well-healed. There is no evidence of recurrent hernia    Assessment:     Patient stable postop     Plan:     He may continue to increase his lifting to normal. He may increase his physical therapy regarding his knee. I will see him back as needed

## 2015-06-05 DIAGNOSIS — B078 Other viral warts: Secondary | ICD-10-CM | POA: Diagnosis not present

## 2015-07-05 DIAGNOSIS — B079 Viral wart, unspecified: Secondary | ICD-10-CM | POA: Diagnosis not present

## 2015-08-22 IMAGING — CR DG CHEST 2V
2 series · 2 of 2 positions shown · non-contrast
Comparison: None.

CLINICAL DATA: Degenerative knee disease. Scheduled for knee
surgery.

EXAM:
CHEST  2 VIEW

[w chest pa]
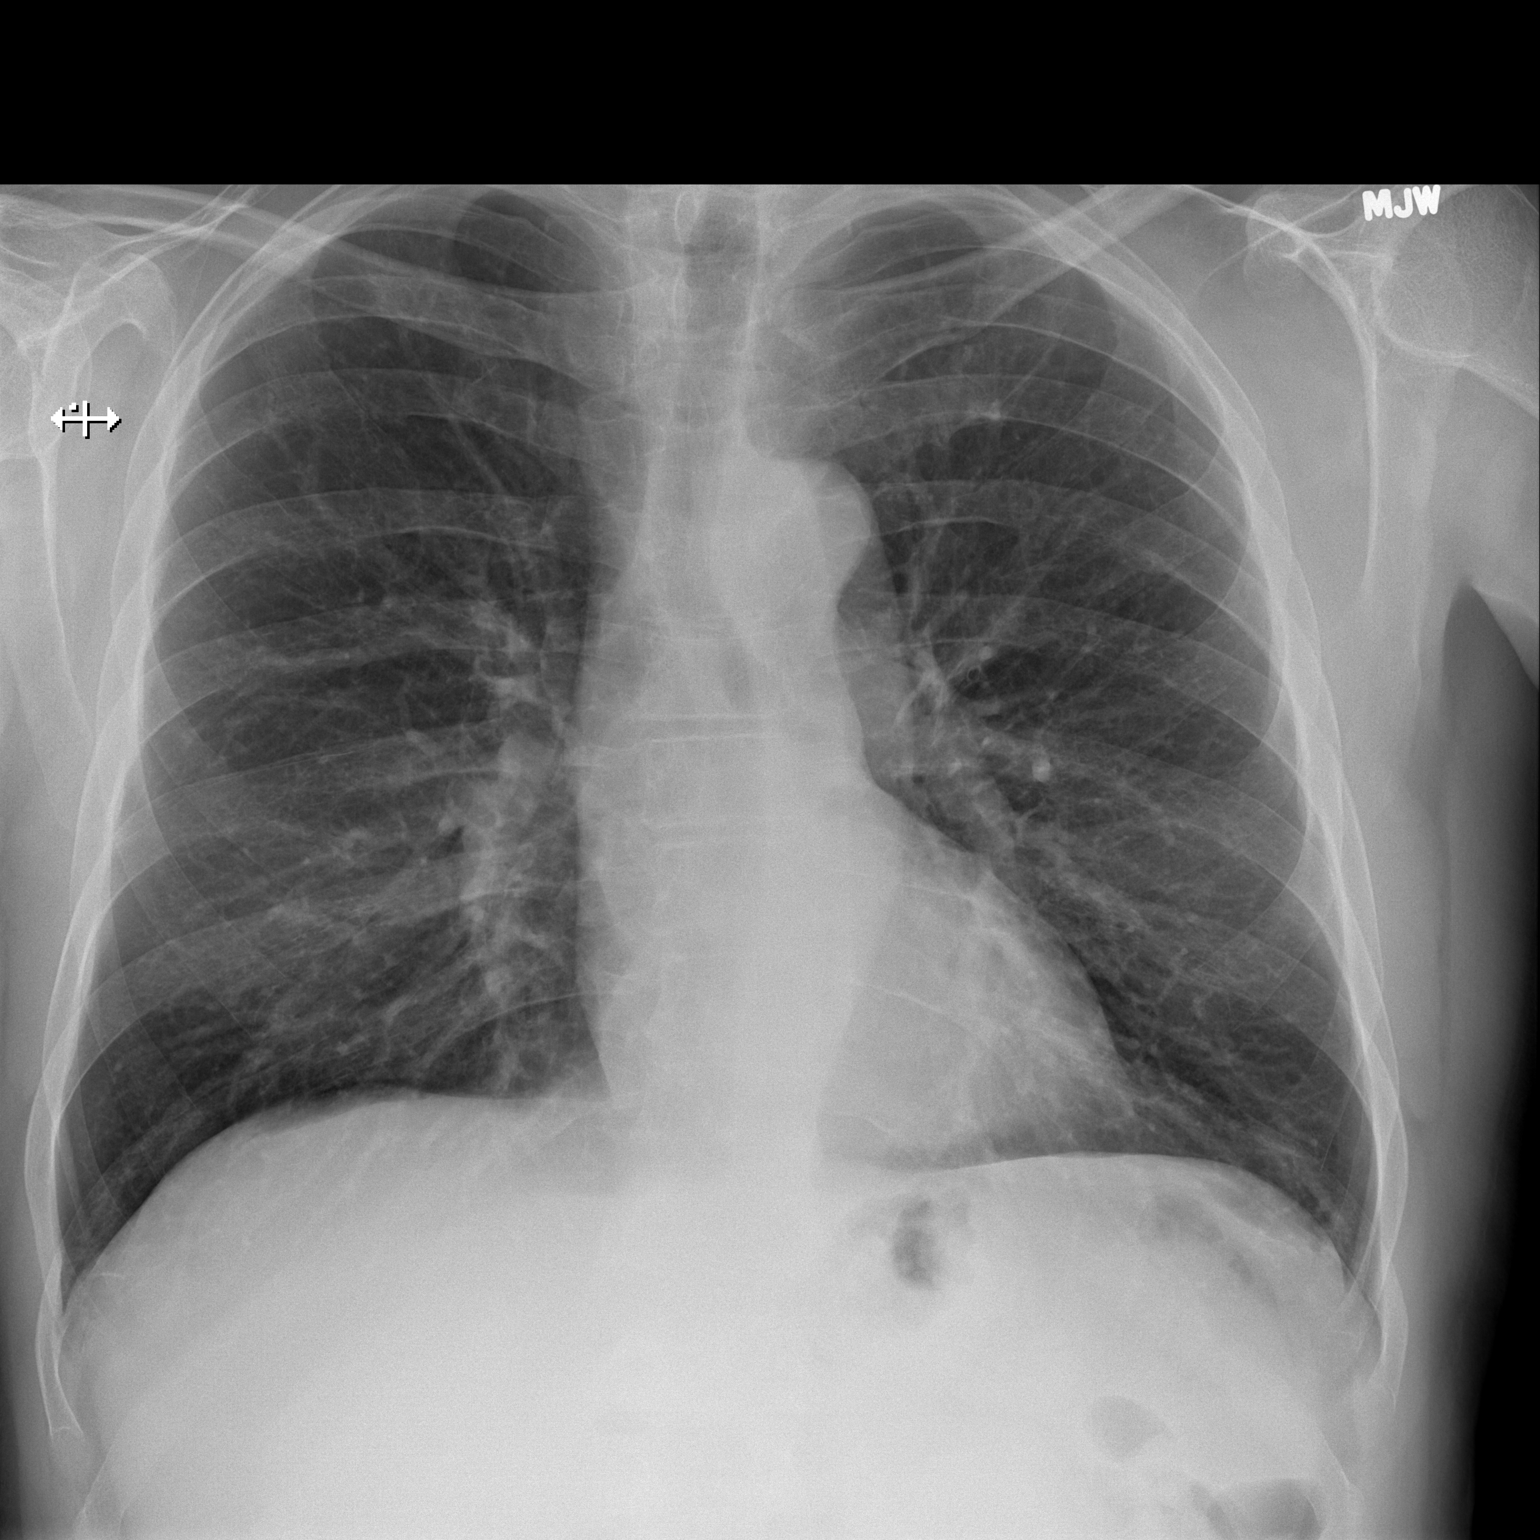

[w chest lat]
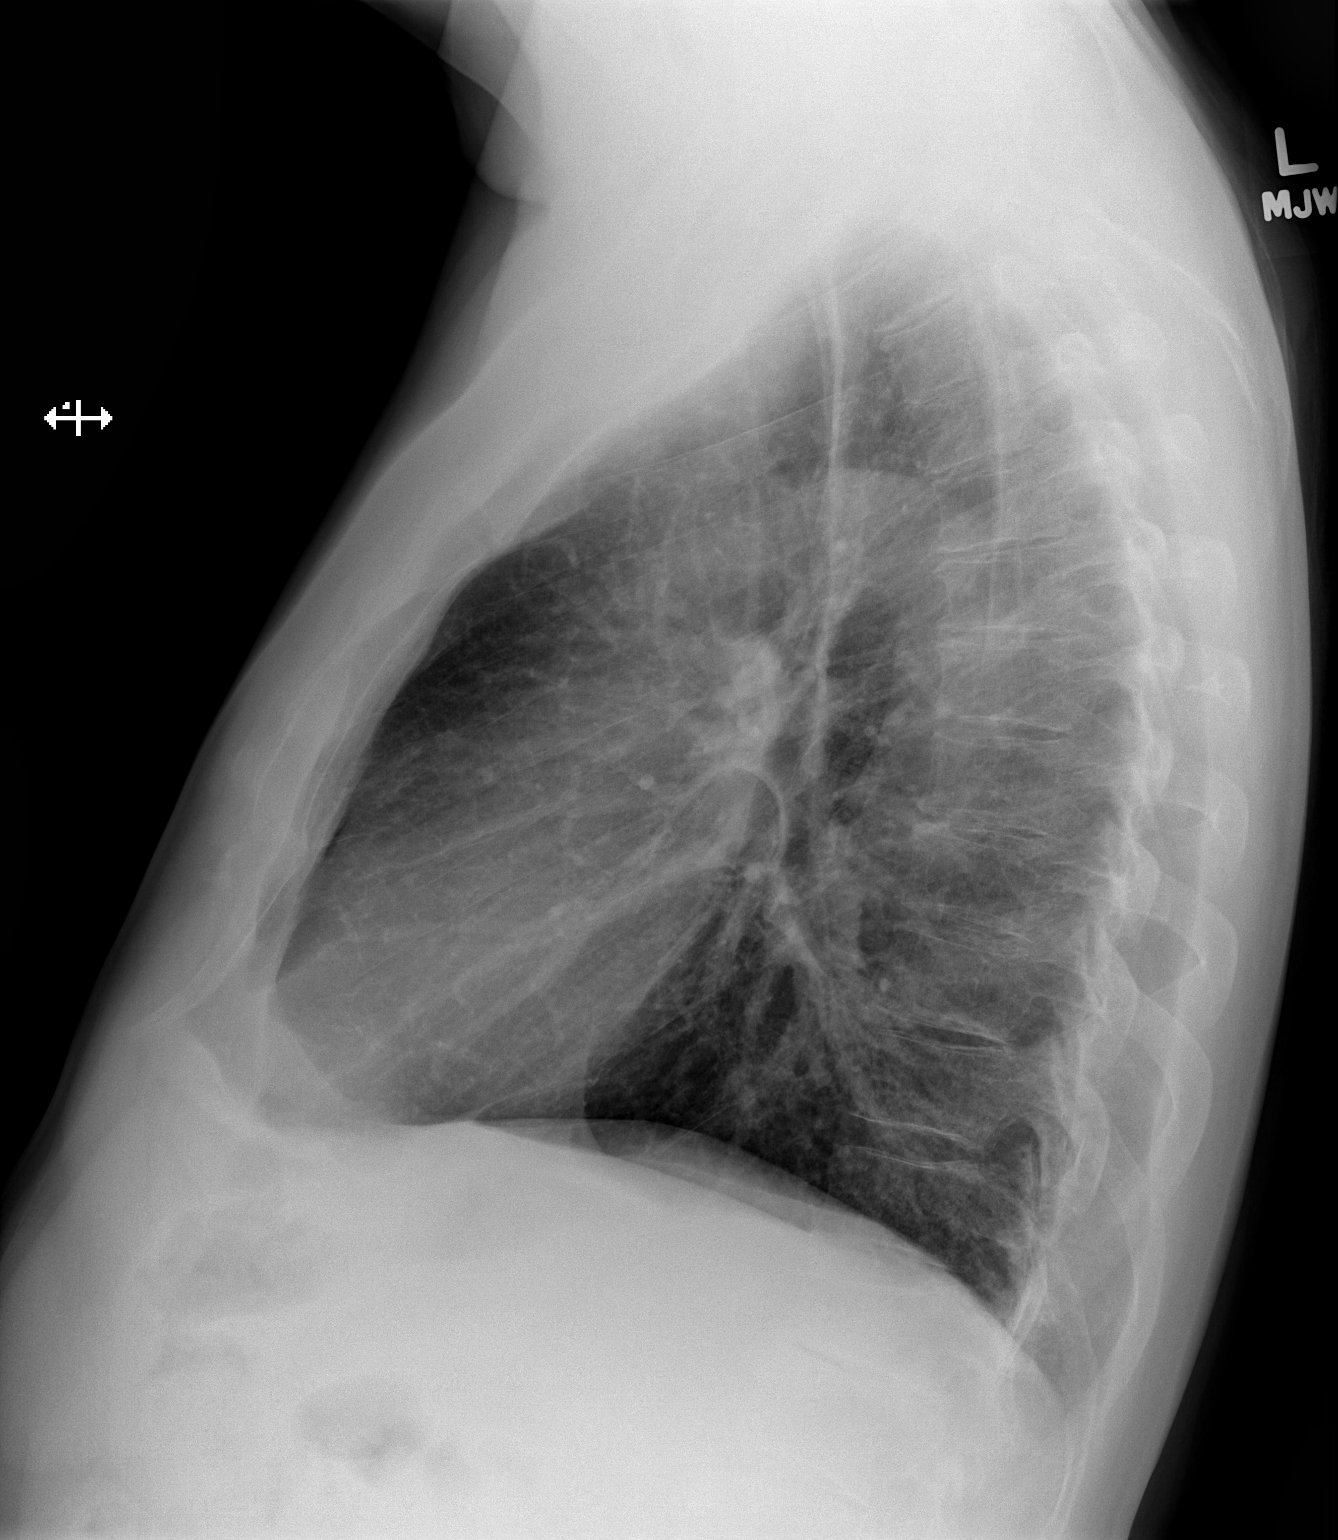

[2 of 2 positions shown; findings below may reference images not displayed]

FINDINGS: The lung markings are mildly prominent but no focal airspace disease
and no evidence for pulmonary edema. Heart and mediastinum are
within normal limits. Difficult to exclude a compression deformity
in the lower thoracic spine but this area is poorly characterized.
IMPRESSION: No acute cardiopulmonary disease.

## 2015-09-18 DIAGNOSIS — Z23 Encounter for immunization: Secondary | ICD-10-CM | POA: Diagnosis not present

## 2015-11-06 DIAGNOSIS — G5602 Carpal tunnel syndrome, left upper limb: Secondary | ICD-10-CM | POA: Diagnosis not present

## 2016-03-08 DIAGNOSIS — Z125 Encounter for screening for malignant neoplasm of prostate: Secondary | ICD-10-CM | POA: Diagnosis not present

## 2016-03-08 DIAGNOSIS — I10 Essential (primary) hypertension: Secondary | ICD-10-CM | POA: Diagnosis not present

## 2016-03-08 DIAGNOSIS — E782 Mixed hyperlipidemia: Secondary | ICD-10-CM | POA: Diagnosis not present

## 2016-03-14 DIAGNOSIS — D229 Melanocytic nevi, unspecified: Secondary | ICD-10-CM | POA: Diagnosis not present

## 2016-03-14 DIAGNOSIS — Z0000 Encounter for general adult medical examination without abnormal findings: Secondary | ICD-10-CM | POA: Diagnosis not present

## 2016-03-14 DIAGNOSIS — R7301 Impaired fasting glucose: Secondary | ICD-10-CM | POA: Diagnosis not present

## 2016-03-14 DIAGNOSIS — Z86010 Personal history of colonic polyps: Secondary | ICD-10-CM | POA: Diagnosis not present

## 2016-03-14 DIAGNOSIS — I10 Essential (primary) hypertension: Secondary | ICD-10-CM | POA: Diagnosis not present

## 2016-03-14 DIAGNOSIS — E782 Mixed hyperlipidemia: Secondary | ICD-10-CM | POA: Diagnosis not present

## 2016-05-13 DIAGNOSIS — Z96652 Presence of left artificial knee joint: Secondary | ICD-10-CM | POA: Diagnosis not present

## 2016-05-13 DIAGNOSIS — M1711 Unilateral primary osteoarthritis, right knee: Secondary | ICD-10-CM | POA: Diagnosis not present

## 2016-05-17 DIAGNOSIS — H1131 Conjunctival hemorrhage, right eye: Secondary | ICD-10-CM | POA: Diagnosis not present

## 2016-06-28 DIAGNOSIS — H40011 Open angle with borderline findings, low risk, right eye: Secondary | ICD-10-CM | POA: Diagnosis not present

## 2016-06-28 DIAGNOSIS — H524 Presbyopia: Secondary | ICD-10-CM | POA: Diagnosis not present

## 2016-06-28 DIAGNOSIS — H40012 Open angle with borderline findings, low risk, left eye: Secondary | ICD-10-CM | POA: Diagnosis not present

## 2016-06-28 DIAGNOSIS — H2513 Age-related nuclear cataract, bilateral: Secondary | ICD-10-CM | POA: Diagnosis not present

## 2016-08-29 DIAGNOSIS — R0681 Apnea, not elsewhere classified: Secondary | ICD-10-CM | POA: Diagnosis not present

## 2016-08-29 DIAGNOSIS — Z23 Encounter for immunization: Secondary | ICD-10-CM | POA: Diagnosis not present

## 2016-08-29 DIAGNOSIS — R0683 Snoring: Secondary | ICD-10-CM | POA: Diagnosis not present

## 2016-09-19 DIAGNOSIS — M1711 Unilateral primary osteoarthritis, right knee: Secondary | ICD-10-CM | POA: Diagnosis not present

## 2016-09-19 DIAGNOSIS — S46011A Strain of muscle(s) and tendon(s) of the rotator cuff of right shoulder, initial encounter: Secondary | ICD-10-CM | POA: Diagnosis not present

## 2016-10-07 DIAGNOSIS — M1711 Unilateral primary osteoarthritis, right knee: Secondary | ICD-10-CM | POA: Diagnosis not present

## 2016-10-14 DIAGNOSIS — M1711 Unilateral primary osteoarthritis, right knee: Secondary | ICD-10-CM | POA: Diagnosis not present

## 2016-10-21 DIAGNOSIS — M1711 Unilateral primary osteoarthritis, right knee: Secondary | ICD-10-CM | POA: Diagnosis not present

## 2016-10-31 DIAGNOSIS — R0681 Apnea, not elsewhere classified: Secondary | ICD-10-CM | POA: Diagnosis not present

## 2016-11-11 DIAGNOSIS — H40013 Open angle with borderline findings, low risk, bilateral: Secondary | ICD-10-CM | POA: Diagnosis not present

## 2016-11-26 DIAGNOSIS — G4730 Sleep apnea, unspecified: Secondary | ICD-10-CM | POA: Diagnosis not present

## 2016-11-26 DIAGNOSIS — G4733 Obstructive sleep apnea (adult) (pediatric): Secondary | ICD-10-CM | POA: Diagnosis not present

## 2016-11-28 DIAGNOSIS — G4733 Obstructive sleep apnea (adult) (pediatric): Secondary | ICD-10-CM | POA: Diagnosis not present

## 2016-12-16 DIAGNOSIS — H40013 Open angle with borderline findings, low risk, bilateral: Secondary | ICD-10-CM | POA: Diagnosis not present

## 2017-02-27 DIAGNOSIS — K439 Ventral hernia without obstruction or gangrene: Secondary | ICD-10-CM | POA: Diagnosis not present

## 2017-03-06 DIAGNOSIS — R0902 Hypoxemia: Secondary | ICD-10-CM | POA: Diagnosis not present

## 2017-04-11 DIAGNOSIS — I10 Essential (primary) hypertension: Secondary | ICD-10-CM | POA: Diagnosis not present

## 2017-04-11 DIAGNOSIS — Z125 Encounter for screening for malignant neoplasm of prostate: Secondary | ICD-10-CM | POA: Diagnosis not present

## 2017-04-17 DIAGNOSIS — E782 Mixed hyperlipidemia: Secondary | ICD-10-CM | POA: Diagnosis not present

## 2017-04-17 DIAGNOSIS — G4730 Sleep apnea, unspecified: Secondary | ICD-10-CM | POA: Diagnosis not present

## 2017-04-17 DIAGNOSIS — R7301 Impaired fasting glucose: Secondary | ICD-10-CM | POA: Diagnosis not present

## 2017-04-17 DIAGNOSIS — R8290 Unspecified abnormal findings in urine: Secondary | ICD-10-CM | POA: Diagnosis not present

## 2017-04-17 DIAGNOSIS — I10 Essential (primary) hypertension: Secondary | ICD-10-CM | POA: Diagnosis not present

## 2017-04-17 DIAGNOSIS — Z86010 Personal history of colonic polyps: Secondary | ICD-10-CM | POA: Diagnosis not present

## 2017-04-17 DIAGNOSIS — Z125 Encounter for screening for malignant neoplasm of prostate: Secondary | ICD-10-CM | POA: Diagnosis not present

## 2017-04-17 DIAGNOSIS — N400 Enlarged prostate without lower urinary tract symptoms: Secondary | ICD-10-CM | POA: Diagnosis not present

## 2017-04-17 DIAGNOSIS — Z0000 Encounter for general adult medical examination without abnormal findings: Secondary | ICD-10-CM | POA: Diagnosis not present

## 2017-04-28 DIAGNOSIS — S46011D Strain of muscle(s) and tendon(s) of the rotator cuff of right shoulder, subsequent encounter: Secondary | ICD-10-CM | POA: Diagnosis not present

## 2017-04-28 DIAGNOSIS — M25562 Pain in left knee: Secondary | ICD-10-CM | POA: Diagnosis not present

## 2017-06-09 DIAGNOSIS — H25013 Cortical age-related cataract, bilateral: Secondary | ICD-10-CM | POA: Diagnosis not present

## 2017-06-09 DIAGNOSIS — H524 Presbyopia: Secondary | ICD-10-CM | POA: Diagnosis not present

## 2017-06-09 DIAGNOSIS — H401131 Primary open-angle glaucoma, bilateral, mild stage: Secondary | ICD-10-CM | POA: Diagnosis not present

## 2017-06-09 DIAGNOSIS — H2513 Age-related nuclear cataract, bilateral: Secondary | ICD-10-CM | POA: Diagnosis not present

## 2017-06-16 DIAGNOSIS — L821 Other seborrheic keratosis: Secondary | ICD-10-CM | POA: Diagnosis not present

## 2017-06-16 DIAGNOSIS — Z1283 Encounter for screening for malignant neoplasm of skin: Secondary | ICD-10-CM | POA: Diagnosis not present

## 2017-08-14 DIAGNOSIS — Z23 Encounter for immunization: Secondary | ICD-10-CM | POA: Diagnosis not present

## 2017-08-29 ENCOUNTER — Other Ambulatory Visit: Payer: Self-pay | Admitting: Surgery

## 2017-08-29 DIAGNOSIS — L089 Local infection of the skin and subcutaneous tissue, unspecified: Secondary | ICD-10-CM | POA: Diagnosis not present

## 2017-08-29 DIAGNOSIS — L723 Sebaceous cyst: Secondary | ICD-10-CM | POA: Diagnosis not present

## 2017-09-22 DIAGNOSIS — L723 Sebaceous cyst: Secondary | ICD-10-CM | POA: Diagnosis not present

## 2017-09-22 DIAGNOSIS — L089 Local infection of the skin and subcutaneous tissue, unspecified: Secondary | ICD-10-CM | POA: Diagnosis not present

## 2017-12-15 DIAGNOSIS — H40013 Open angle with borderline findings, low risk, bilateral: Secondary | ICD-10-CM | POA: Diagnosis not present

## 2018-03-13 DIAGNOSIS — L570 Actinic keratosis: Secondary | ICD-10-CM | POA: Diagnosis not present

## 2018-03-13 DIAGNOSIS — X32XXXD Exposure to sunlight, subsequent encounter: Secondary | ICD-10-CM | POA: Diagnosis not present

## 2018-03-13 DIAGNOSIS — D225 Melanocytic nevi of trunk: Secondary | ICD-10-CM | POA: Diagnosis not present

## 2018-03-13 DIAGNOSIS — Z1283 Encounter for screening for malignant neoplasm of skin: Secondary | ICD-10-CM | POA: Diagnosis not present

## 2018-05-29 DIAGNOSIS — D126 Benign neoplasm of colon, unspecified: Secondary | ICD-10-CM | POA: Diagnosis not present

## 2018-05-29 DIAGNOSIS — K5730 Diverticulosis of large intestine without perforation or abscess without bleeding: Secondary | ICD-10-CM | POA: Diagnosis not present

## 2018-05-29 DIAGNOSIS — Z86010 Personal history of colonic polyps: Secondary | ICD-10-CM | POA: Diagnosis not present

## 2018-05-29 DIAGNOSIS — K635 Polyp of colon: Secondary | ICD-10-CM | POA: Diagnosis not present

## 2018-06-02 DIAGNOSIS — D126 Benign neoplasm of colon, unspecified: Secondary | ICD-10-CM | POA: Diagnosis not present

## 2018-06-02 DIAGNOSIS — K635 Polyp of colon: Secondary | ICD-10-CM | POA: Diagnosis not present

## 2018-06-18 DIAGNOSIS — Z125 Encounter for screening for malignant neoplasm of prostate: Secondary | ICD-10-CM | POA: Diagnosis not present

## 2018-06-18 DIAGNOSIS — I10 Essential (primary) hypertension: Secondary | ICD-10-CM | POA: Diagnosis not present

## 2018-06-19 DIAGNOSIS — I10 Essential (primary) hypertension: Secondary | ICD-10-CM | POA: Diagnosis not present

## 2018-06-19 DIAGNOSIS — E782 Mixed hyperlipidemia: Secondary | ICD-10-CM | POA: Diagnosis not present

## 2018-06-19 DIAGNOSIS — G4730 Sleep apnea, unspecified: Secondary | ICD-10-CM | POA: Diagnosis not present

## 2018-06-19 DIAGNOSIS — R7301 Impaired fasting glucose: Secondary | ICD-10-CM | POA: Diagnosis not present

## 2018-06-19 DIAGNOSIS — K4091 Unilateral inguinal hernia, without obstruction or gangrene, recurrent: Secondary | ICD-10-CM | POA: Diagnosis not present

## 2018-06-19 DIAGNOSIS — E871 Hypo-osmolality and hyponatremia: Secondary | ICD-10-CM | POA: Diagnosis not present

## 2018-06-19 DIAGNOSIS — R8290 Unspecified abnormal findings in urine: Secondary | ICD-10-CM | POA: Diagnosis not present

## 2018-06-19 DIAGNOSIS — Z0000 Encounter for general adult medical examination without abnormal findings: Secondary | ICD-10-CM | POA: Diagnosis not present

## 2018-06-19 DIAGNOSIS — D229 Melanocytic nevi, unspecified: Secondary | ICD-10-CM | POA: Diagnosis not present

## 2018-06-19 DIAGNOSIS — N400 Enlarged prostate without lower urinary tract symptoms: Secondary | ICD-10-CM | POA: Diagnosis not present

## 2018-06-19 DIAGNOSIS — Z86010 Personal history of colonic polyps: Secondary | ICD-10-CM | POA: Diagnosis not present

## 2018-06-22 DIAGNOSIS — H40013 Open angle with borderline findings, low risk, bilateral: Secondary | ICD-10-CM | POA: Diagnosis not present

## 2018-06-22 DIAGNOSIS — H2513 Age-related nuclear cataract, bilateral: Secondary | ICD-10-CM | POA: Diagnosis not present

## 2018-06-22 DIAGNOSIS — H25013 Cortical age-related cataract, bilateral: Secondary | ICD-10-CM | POA: Diagnosis not present

## 2018-07-09 DIAGNOSIS — D229 Melanocytic nevi, unspecified: Secondary | ICD-10-CM | POA: Diagnosis not present

## 2018-07-09 DIAGNOSIS — K4091 Unilateral inguinal hernia, without obstruction or gangrene, recurrent: Secondary | ICD-10-CM | POA: Diagnosis not present

## 2018-07-09 DIAGNOSIS — Z86010 Personal history of colonic polyps: Secondary | ICD-10-CM | POA: Diagnosis not present

## 2018-07-09 DIAGNOSIS — R8290 Unspecified abnormal findings in urine: Secondary | ICD-10-CM | POA: Diagnosis not present

## 2018-07-09 DIAGNOSIS — E782 Mixed hyperlipidemia: Secondary | ICD-10-CM | POA: Diagnosis not present

## 2018-07-09 DIAGNOSIS — I10 Essential (primary) hypertension: Secondary | ICD-10-CM | POA: Diagnosis not present

## 2018-07-09 DIAGNOSIS — G4730 Sleep apnea, unspecified: Secondary | ICD-10-CM | POA: Diagnosis not present

## 2018-07-09 DIAGNOSIS — Z0000 Encounter for general adult medical examination without abnormal findings: Secondary | ICD-10-CM | POA: Diagnosis not present

## 2018-07-09 DIAGNOSIS — R7301 Impaired fasting glucose: Secondary | ICD-10-CM | POA: Diagnosis not present

## 2018-07-09 DIAGNOSIS — E871 Hypo-osmolality and hyponatremia: Secondary | ICD-10-CM | POA: Diagnosis not present

## 2018-07-09 DIAGNOSIS — N400 Enlarged prostate without lower urinary tract symptoms: Secondary | ICD-10-CM | POA: Diagnosis not present

## 2018-07-21 ENCOUNTER — Other Ambulatory Visit: Payer: Self-pay | Admitting: Surgery

## 2018-07-21 DIAGNOSIS — K4091 Unilateral inguinal hernia, without obstruction or gangrene, recurrent: Secondary | ICD-10-CM | POA: Diagnosis not present

## 2018-07-22 ENCOUNTER — Encounter (HOSPITAL_COMMUNITY): Payer: Self-pay | Admitting: *Deleted

## 2018-07-22 ENCOUNTER — Encounter (HOSPITAL_COMMUNITY): Payer: Self-pay

## 2018-07-22 NOTE — Patient Instructions (Signed)
Your procedure is scheduled on: Monday, Sept. 23, 2019   Surgery Time:  10:30AM-11:30AM   Report to [facility]  Entrance    Report to admitting at 8:30 AM   Call this number if you have problems the morning of surgery [phone]   Brush your teeth the morning of surgery.   Do NOT smoke after Midnight   Do not eat food:After Midnight.   MAY HAVE LIQUIDS UNTIL 7:30am DAY OF SURGERY      CLEAR LIQUID DIET   Foods Allowed                                                                     Foods Excluded  Coffee and tea, regular and decaf                             liquids that you cannot  Plain Jell-O in any flavor                                             see through such as: Fruit ices (not with fruit pulp)                                     milk, soups, orange juice  Iced Popsicles                                    All solid food Carbonated beverages, regular and diet                                    Cranberry, grape and apple juices Sports drinks like Gatorade Lightly seasoned clear broth or consume(fat free) Sugar, honey syrup  Sample Menu Breakfast                                Lunch                                     Supper Cranberry juice                    Beef broth                            Chicken broth Jell-O                                     Grape juice                           Apple juice Coffee or tea  Jell-O                                      Popsicle                                                Coffee or tea                        Coffee or tea   Complete one Ensure drink the morning of surgery by  7:30am the day of surgery.   Take these medicines the morning of surgery with A SIP OF WATER: ATORVASTATIN, GEMFIBROZIL, NIFEDIPINE                               You may not have any metal on your body including jewelry, and body piercings             Do not wear lotions, powders, perfumes/cologne,  or deodorant                           Men may shave face and neck.   Do not bring valuables to the hospital. [place].   Contacts, dentures or bridgework may not be worn into surgery.    Patients discharged the day of surgery will not be allowed to drive home.   Special Instructions: Bring a copy of your healthcare power of attorney and living will documents         the day of surgery if you haven't scanned them in before.              Please read over the following fact sheets you were given:  [facility] - Preparing for Surgery Before surgery, you can play an important role.  Because skin is not sterile, your skin needs to be as free of germs as possible.  You can reduce the number of germs on your skin by washing with CHG (chlorahexidine gluconate) soap before surgery.  CHG is an antiseptic cleaner which kills germs and bonds with the skin to continue killing germs even after washing. Please DO NOT use if you have an allergy to CHG or antibacterial soaps.  If your skin becomes reddened/irritated stop using the CHG and inform your nurse when you arrive at Short Stay. Do not shave (including legs and underarms) for at least 48 hours prior to the first CHG shower.  You may shave your face/neck.  Please follow these instructions carefully:  1.  Shower with CHG Soap the night before surgery and the  morning of surgery.  2.  If you choose to wash your hair, wash your hair first as usual with your normal  shampoo.  3.  After you shampoo, rinse your hair and body thoroughly to remove the shampoo.                             4.  Use CHG as you would any other liquid soap.  You can apply chg directly to  the skin and wash.  Gently with a scrungie or clean washcloth.  5.  Apply the CHG Soap to your body ONLY FROM THE NECK DOWN.   Do   not use on face/ open                           Wound or open sores. Avoid contact with eyes, ears mouth and    genitals (private parts).                       Wash face,  Genitals (private parts) with your normal soap.             6.  Wash thoroughly, paying special attention to the area where your    surgery  will be performed.  7.  Thoroughly rinse your body with warm water from the neck down.  8.  DO NOT shower/wash with your normal soap after using and rinsing off the CHG Soap.                9.  Pat yourself dry with a clean towel.            10.  Wear clean pajamas.            11.  Place clean sheets on your bed the night of your first shower and do not  sleep with pets. Day of Surgery : Do not apply any lotions/deodorants the morning of surgery.  Please wear clean clothes to the hospital/surgery center.  FAILURE TO FOLLOW THESE INSTRUCTIONS MAY RESULT IN THE CANCELLATION OF YOUR SURGERY  PATIENT SIGNATURE_________________________________  NURSE SIGNATURE__________________________________  ________________________________________________________________________

## 2018-07-23 ENCOUNTER — Encounter (HOSPITAL_COMMUNITY): Payer: Self-pay

## 2018-07-23 ENCOUNTER — Encounter (HOSPITAL_COMMUNITY)
Admission: RE | Admit: 2018-07-23 | Discharge: 2018-07-23 | Disposition: A | Discharge status: Home / Self Care | Payer: Medicare Other | Source: Ambulatory Visit | Attending: Surgery | Admitting: Surgery

## 2018-07-23 ENCOUNTER — Other Ambulatory Visit: Payer: Self-pay

## 2018-07-23 DIAGNOSIS — Z01818 Encounter for other preprocedural examination: Secondary | ICD-10-CM | POA: Insufficient documentation

## 2018-07-23 DIAGNOSIS — K4091 Unilateral inguinal hernia, without obstruction or gangrene, recurrent: Secondary | ICD-10-CM | POA: Diagnosis not present

## 2018-07-23 DIAGNOSIS — R9431 Abnormal electrocardiogram [ECG] [EKG]: Secondary | ICD-10-CM | POA: Diagnosis not present

## 2018-07-23 DIAGNOSIS — I444 Left anterior fascicular block: Secondary | ICD-10-CM | POA: Insufficient documentation

## 2018-07-23 HISTORY — DX: Abnormal electrocardiogram (ECG) (EKG): R94.31

## 2018-07-23 HISTORY — DX: Personal history of colon polyps, unspecified: Z86.0100

## 2018-07-23 HISTORY — DX: Sleep apnea, unspecified: G47.30

## 2018-07-23 HISTORY — DX: Personal history of colonic polyps: Z86.010

## 2018-07-23 HISTORY — DX: Diverticulosis of intestine, part unspecified, without perforation or abscess without bleeding: K57.90

## 2018-07-23 HISTORY — DX: Unspecified osteoarthritis, unspecified site: M19.90

## 2018-07-23 HISTORY — DX: Personal history of other diseases of the circulatory system: Z86.79

## 2018-07-23 LAB — CBC
HEMATOCRIT: 47.3 % (ref 39.0–52.0)
HEMOGLOBIN: 16.5 g/dL (ref 13.0–17.0)
MCH: 32.4 pg (ref 26.0–34.0)
MCHC: 34.9 g/dL (ref 30.0–36.0)
MCV: 92.9 fL (ref 78.0–100.0)
Platelets: 235 10*3/uL (ref 150–400)
RBC: 5.09 MIL/uL (ref 4.22–5.81)
RDW: 12.3 % (ref 11.5–15.5)
WBC: 5.4 10*3/uL (ref 4.0–10.5)

## 2018-07-23 LAB — BASIC METABOLIC PANEL
ANION GAP: 11 (ref 5–15)
BUN: 15 mg/dL (ref 8–23)
CALCIUM: 9.5 mg/dL (ref 8.9–10.3)
CO2: 26 mmol/L (ref 22–32)
Chloride: 99 mmol/L (ref 98–111)
Creatinine, Ser: 0.75 mg/dL (ref 0.61–1.24)
GFR calc Af Amer: >60 mL/min (ref >60)
GFR calc non Af Amer: >60 mL/min (ref >60)
GLUCOSE: 113 mg/dL — AB (ref 70–99)
Potassium: 3.7 mmol/L (ref 3.5–5.1)
Sodium: 136 mmol/L (ref 135–145)

## 2018-07-23 NOTE — Pre-Procedure Instructions (Signed)
Final EKG pending, left chart with Rise Paganini, RN to follow up.

## 2018-07-26 NOTE — H&P (Signed)
   Steven Austin Documented: 07/21/2018 4:17 PM Location: [facility] Patient #: [id number] DOB: 1946-02-15 Married / Language: Steven Austin / Race: White Male   History of Present Illness (Steven Austin A. Ninfa Linden MD; 07/21/2018 4:38 PM) The patient is a 72 year old male who presents with an inguinal hernia. This gentleman is known to me. I did bilateral laparoscopic inguinal hernia repairs with mesh in 2015. He has recently noticed a painful but reducible bulge in his right inguinal area. He reports that the left inguinal area has been doing well. He's had no nausea or vomiting but just moderate discomfort. The pain is intermittent.   Allergies (Steven Austin, [place]; 07/21/2018 4:18 PM) Augmentin *PENICILLINS*  Allergies Reconciled   Medication History (Steven Austin, RMA; 07/21/2018 4:18 PM) Amoxicillin (500MG  Capsule, Oral) Active. Atorvastatin Calcium (20MG  Tablet, Oral) Active. Gemfibrozil (600MG  Tablet, Oral) Active. HydroCHLOROthiazide (25MG  Tablet, Oral) Active. Latanoprost (0.005% Solution, Ophthalmic) Active. NIFEdipine ER (60MG  Tablet ER 24HR, Oral) Active. Aspirin (325MG  Tablet, Oral) Active. CeleBREX (200MG  Capsule, Oral) Active. Multiple Vitamins (Oral) Active. Medications Reconciled  Vitals (Steven Austin RMA; 07/21/2018 4:17 PM) 07/21/2018 4:17 PM Weight: 198.6 lb Height: 69in Body Surface Area: 2.06 m Body Mass Index: 29.33 kg/m  Temp.: 98.63F  Pulse: 90 (Regular)  BP: 126/82 (Sitting, Left Arm, Standard)       Physical Exam (Steven Austin A. Ninfa Linden MD; 07/21/2018 4:38 PM) The physical exam findings are as follows: Note:On examination, he does have a reducible right inguinal hernia without evidence of left inguinal hernia General well in appearance Skin without rash Lungs clear CV RRR Abdomen soft, NT    Assessment & Plan (Steven Austin A. Ninfa Linden MD;  07/21/2018 4:39 PM)  RECURRENT RIGHT INGUINAL HERNIA  (K40.91)  Impression: Repair again with mesh is recommended. I believe need to do this as an open repair as I would not be to get back into the preperitoneal space laparoscopically again. I discussed the reasons for this with him. We discussed open running hernia repair with a possible tap block. I discussed the risks of surgery which includes but is not limited to bleeding, infection, injury to surrounding structures, using mesh, nerve entrapment, chronic pain, cardiopulmonary issues, DVT, etc. I recommend urgent repair given his symptoms. He understands and agrees with surgery

## 2018-07-27 ENCOUNTER — Ambulatory Visit (HOSPITAL_COMMUNITY): Payer: Medicare Other | Admitting: Anesthesiology

## 2018-07-27 ENCOUNTER — Encounter (HOSPITAL_COMMUNITY)
Admission: RE | Disposition: A | Discharge status: Home / Self Care | Payer: Self-pay | Source: Ambulatory Visit | Attending: Surgery

## 2018-07-27 ENCOUNTER — Ambulatory Visit (HOSPITAL_COMMUNITY)
Admission: RE | Admit: 2018-07-27 | Discharge: 2018-07-27 | Disposition: A | Discharge status: Home / Self Care | Payer: Medicare Other | Source: Ambulatory Visit | Attending: Surgery | Admitting: Surgery

## 2018-07-27 ENCOUNTER — Encounter (HOSPITAL_COMMUNITY): Payer: Self-pay | Admitting: Anesthesiology

## 2018-07-27 DIAGNOSIS — Z791 Long term (current) use of non-steroidal anti-inflammatories (NSAID): Secondary | ICD-10-CM | POA: Diagnosis not present

## 2018-07-27 DIAGNOSIS — Z7982 Long term (current) use of aspirin: Secondary | ICD-10-CM | POA: Diagnosis not present

## 2018-07-27 DIAGNOSIS — Z79899 Other long term (current) drug therapy: Secondary | ICD-10-CM | POA: Insufficient documentation

## 2018-07-27 DIAGNOSIS — I10 Essential (primary) hypertension: Secondary | ICD-10-CM | POA: Insufficient documentation

## 2018-07-27 DIAGNOSIS — G8918 Other acute postprocedural pain: Secondary | ICD-10-CM | POA: Diagnosis not present

## 2018-07-27 DIAGNOSIS — Z87891 Personal history of nicotine dependence: Secondary | ICD-10-CM | POA: Insufficient documentation

## 2018-07-27 DIAGNOSIS — K4091 Unilateral inguinal hernia, without obstruction or gangrene, recurrent: Secondary | ICD-10-CM | POA: Insufficient documentation

## 2018-07-27 DIAGNOSIS — E785 Hyperlipidemia, unspecified: Secondary | ICD-10-CM | POA: Diagnosis not present

## 2018-07-27 HISTORY — PX: INSERTION OF MESH: SHX5868

## 2018-07-27 HISTORY — PX: INGUINAL HERNIA REPAIR: SHX194

## 2018-07-27 SURGERY — REPAIR, HERNIA, INGUINAL, ADULT
Anesthesia: General | Site: Inguinal | Laterality: Right

## 2018-07-27 MED ORDER — ACETAMINOPHEN 500 MG PO TABS
1000.0000 mg | ORAL_TABLET | ORAL | Status: AC
  Administered 2018-07-27: 1000 mg via ORAL
  Filled 2018-07-27: qty 2

## 2018-07-27 MED ORDER — PROPOFOL 10 MG/ML IV BOLUS
INTRAVENOUS | Status: DC | PRN
  Administered 2018-07-27: 200 mg via INTRAVENOUS

## 2018-07-27 MED ORDER — ONDANSETRON HCL 4 MG/2ML IJ SOLN
INTRAMUSCULAR | Status: AC
  Filled 2018-07-27: qty 2

## 2018-07-27 MED ORDER — FENTANYL CITRATE (PF) 100 MCG/2ML IJ SOLN
INTRAMUSCULAR | Status: DC | PRN
  Administered 2018-07-27: 25 ug via INTRAVENOUS
  Administered 2018-07-27: 50 ug via INTRAVENOUS
  Administered 2018-07-27: 75 ug via INTRAVENOUS
  Administered 2018-07-27 (×2): 25 ug via INTRAVENOUS

## 2018-07-27 MED ORDER — KETOROLAC TROMETHAMINE 30 MG/ML IJ SOLN: 15.0000 mg | Freq: Once | INTRAMUSCULAR | Status: DC | PRN

## 2018-07-27 MED ORDER — 0.9 % SODIUM CHLORIDE (POUR BTL) OPTIME
TOPICAL | Status: DC | PRN
  Administered 2018-07-27: 1000 mL

## 2018-07-27 MED ORDER — KETAMINE HCL 10 MG/ML IJ SOLN
INTRAMUSCULAR | Status: DC | PRN
  Administered 2018-07-27: 40 mg via INTRAVENOUS

## 2018-07-27 MED ORDER — LACTATED RINGERS IV SOLN
INTRAVENOUS | Status: DC
  Administered 2018-07-27: 09:00:00 via INTRAVENOUS

## 2018-07-27 MED ORDER — PROPOFOL 10 MG/ML IV BOLUS
INTRAVENOUS | Status: AC
  Filled 2018-07-27: qty 20

## 2018-07-27 MED ORDER — FENTANYL CITRATE (PF) 100 MCG/2ML IJ SOLN
50.0000 ug | Freq: Once | INTRAMUSCULAR | Status: AC
  Administered 2018-07-27: 50 ug via INTRAVENOUS
  Filled 2018-07-27: qty 2

## 2018-07-27 MED ORDER — LIDOCAINE HCL 2 % IJ SOLN
INTRAMUSCULAR | Status: AC
  Filled 2018-07-27: qty 20

## 2018-07-27 MED ORDER — LIDOCAINE 2% (20 MG/ML) 5 ML SYRINGE
INTRAMUSCULAR | Status: AC
  Filled 2018-07-27: qty 5

## 2018-07-27 MED ORDER — DEXAMETHASONE SODIUM PHOSPHATE 10 MG/ML IJ SOLN
INTRAMUSCULAR | Status: DC | PRN
  Administered 2018-07-27: 10 mg via INTRAVENOUS

## 2018-07-27 MED ORDER — LIDOCAINE HCL (CARDIAC) PF 100 MG/5ML IV SOSY
PREFILLED_SYRINGE | INTRAVENOUS | Status: DC | PRN
  Administered 2018-07-27: 50 mg via INTRAVENOUS

## 2018-07-27 MED ORDER — LIDOCAINE 2% (20 MG/ML) 5 ML SYRINGE
INTRAMUSCULAR | Status: DC | PRN
  Administered 2018-07-27: 1.5 mg/kg/h via INTRAVENOUS

## 2018-07-27 MED ORDER — BUPIVACAINE-EPINEPHRINE (PF) 0.25% -1:200000 IJ SOLN
INTRAMUSCULAR | Status: DC | PRN
  Administered 2018-07-27: 10 mL

## 2018-07-27 MED ORDER — ROCURONIUM BROMIDE 10 MG/ML (PF) SYRINGE
PREFILLED_SYRINGE | INTRAVENOUS | Status: AC
  Filled 2018-07-27: qty 10

## 2018-07-27 MED ORDER — ROPIVACAINE HCL 5 MG/ML IJ SOLN
INTRAMUSCULAR | Status: DC | PRN
  Administered 2018-07-27: 30 mL

## 2018-07-27 MED ORDER — DEXAMETHASONE SODIUM PHOSPHATE 10 MG/ML IJ SOLN
INTRAMUSCULAR | Status: AC
  Filled 2018-07-27: qty 1

## 2018-07-27 MED ORDER — MIDAZOLAM HCL 2 MG/2ML IJ SOLN
INTRAMUSCULAR | Status: AC
  Filled 2018-07-27: qty 2

## 2018-07-27 MED ORDER — CHLORHEXIDINE GLUCONATE CLOTH 2 % EX PADS: 6.0000 | MEDICATED_PAD | Freq: Once | CUTANEOUS | Status: DC

## 2018-07-27 MED ORDER — CIPROFLOXACIN IN D5W 400 MG/200ML IV SOLN
400.0000 mg | INTRAVENOUS | Status: AC
  Administered 2018-07-27: 400 mg via INTRAVENOUS
  Filled 2018-07-27: qty 200

## 2018-07-27 MED ORDER — TRAMADOL HCL 50 MG PO TABS: 50.0000 mg | ORAL_TABLET | Freq: Four times a day (QID) | ORAL | 0 refills | Status: DC | PRN

## 2018-07-27 MED ORDER — GABAPENTIN 300 MG PO CAPS
300.0000 mg | ORAL_CAPSULE | ORAL | Status: AC
  Administered 2018-07-27: 300 mg via ORAL
  Filled 2018-07-27: qty 1

## 2018-07-27 MED ORDER — PROMETHAZINE HCL 25 MG/ML IJ SOLN: 6.2500 mg | INTRAMUSCULAR | Status: DC | PRN

## 2018-07-27 MED ORDER — BUPIVACAINE-EPINEPHRINE (PF) 0.25% -1:200000 IJ SOLN
INTRAMUSCULAR | Status: AC
  Filled 2018-07-27: qty 30

## 2018-07-27 MED ORDER — MIDAZOLAM HCL 2 MG/2ML IJ SOLN
1.0000 mg | Freq: Once | INTRAMUSCULAR | Status: AC
  Administered 2018-07-27: 2 mg via INTRAVENOUS
  Filled 2018-07-27: qty 2

## 2018-07-27 MED ORDER — BUPIVACAINE LIPOSOME 1.3 % IJ SUSP
20.0000 mL | Freq: Once | INTRAMUSCULAR | Status: DC
  Filled 2018-07-27: qty 20

## 2018-07-27 MED ORDER — KETAMINE HCL 10 MG/ML IJ SOLN
INTRAMUSCULAR | Status: AC
  Filled 2018-07-27: qty 1

## 2018-07-27 MED ORDER — ONDANSETRON HCL 4 MG/2ML IJ SOLN
INTRAMUSCULAR | Status: DC | PRN
  Administered 2018-07-27: 4 mg via INTRAVENOUS

## 2018-07-27 MED ORDER — FENTANYL CITRATE (PF) 100 MCG/2ML IJ SOLN: 25.0000 ug | INTRAMUSCULAR | Status: DC | PRN

## 2018-07-27 MED ORDER — FENTANYL CITRATE (PF) 250 MCG/5ML IJ SOLN
INTRAMUSCULAR | Status: AC
  Filled 2018-07-27: qty 5

## 2018-07-27 SURGICAL SUPPLY — 30 items
BLADE SURG 15 STRL LF DISP TIS (BLADE) ×1 IMPLANT
BLADE SURG 15 STRL SS (BLADE) ×2
CHLORAPREP W/TINT 26ML (MISCELLANEOUS) ×3 IMPLANT
DECANTER SPIKE VIAL GLASS SM (MISCELLANEOUS) IMPLANT
DERMABOND ADVANCED (GAUZE/BANDAGES/DRESSINGS) ×2
DERMABOND ADVANCED .7 DNX12 (GAUZE/BANDAGES/DRESSINGS) ×1 IMPLANT
DRAIN PENROSE 18X1/2 LTX STRL (DRAIN) ×3 IMPLANT
DRAPE LAPAROTOMY TRNSV 102X78 (DRAPE) ×3 IMPLANT
ELECT PENCIL ROCKER SW 15FT (MISCELLANEOUS) ×3 IMPLANT
ELECT REM PT RETURN 15FT ADLT (MISCELLANEOUS) ×3 IMPLANT
GAUZE SPONGE 4X4 12PLY STRL (GAUZE/BANDAGES/DRESSINGS) IMPLANT
GLOVE SURG SIGNA 7.5 PF LTX (GLOVE) ×3 IMPLANT
GOWN STRL REUS W/TWL XL LVL3 (GOWN DISPOSABLE) ×6 IMPLANT
KIT BASIN OR (CUSTOM PROCEDURE TRAY) ×3 IMPLANT
MESH PARIETEX PROGRIP RIGHT (Mesh General) ×3 IMPLANT
NEEDLE HYPO 25X1 1.5 SAFETY (NEEDLE) ×3 IMPLANT
PACK BASIC VI WITH GOWN DISP (CUSTOM PROCEDURE TRAY) ×3 IMPLANT
SPONGE LAP 4X18 RFD (DISPOSABLE) ×3 IMPLANT
SUT MNCRL AB 4-0 PS2 18 (SUTURE) ×3 IMPLANT
SUT SILK 2 0 SH (SUTURE) ×3 IMPLANT
SUT VIC AB 2-0 CT1 27 (SUTURE)
SUT VIC AB 2-0 CT1 TAPERPNT 27 (SUTURE) IMPLANT
SUT VIC AB 3-0 54XBRD REEL (SUTURE) IMPLANT
SUT VIC AB 3-0 BRD 54 (SUTURE)
SUT VIC AB 3-0 SH 27 (SUTURE)
SUT VIC AB 3-0 SH 27XBRD (SUTURE) IMPLANT
SYR CONTROL 10ML LL (SYRINGE) ×3 IMPLANT
TOWEL OR 17X26 10 PK STRL BLUE (TOWEL DISPOSABLE) ×3 IMPLANT
TOWEL OR NON WOVEN STRL DISP B (DISPOSABLE) ×3 IMPLANT
YANKAUER SUCT BULB TIP 10FT TU (MISCELLANEOUS) IMPLANT

## 2018-07-27 NOTE — Anesthesia Postprocedure Evaluation (Signed)
Anesthesia Post Note  Patient: Steven Austin.  Procedure(s) Performed: RIGHT INGUINAL HERNIA REPAIR ERAS PATHWAY (Right Inguinal) INSERTION OF MESH (Right Inguinal)     Patient location during evaluation: PACU Anesthesia Type: General Level of consciousness: awake and alert Pain management: pain level controlled Vital Signs Assessment: post-procedure vital signs reviewed and stable Respiratory status: spontaneous breathing, nonlabored ventilation, respiratory function stable and patient connected to nasal cannula oxygen Cardiovascular status: blood pressure returned to baseline and stable Postop Assessment: no apparent nausea or vomiting Anesthetic complications: no    Last Vitals:  Vitals:   07/27/18 1200 07/27/18 1216  BP: 134/81 (!) 151/104  Pulse: 77 85  Resp: 16 16  Temp: 36.6 C 36.4 C  SpO2: 97% 98%    Last Pain:  Vitals:   07/27/18 1216  TempSrc:   PainSc: 2                  Datron Brakebill S

## 2018-07-27 NOTE — Interval H&P Note (Signed)
History and Physical Interval Note:no change in H and P  07/27/2018 10:05 AM  Steven Drucie Ip.  has presented today for surgery, with the diagnosis of recurrent right inguinal hernia  The various methods of treatment have been discussed with the patient and family. After consideration of risks, benefits and other options for treatment, the patient has consented to  Procedure(s) with comments: [place] (Right) - TAP BLOCK INSERTION OF MESH (Right) as a surgical intervention .  The patient's history has been reviewed, patient examined, no change in status, stable for surgery.  I have reviewed the patient's chart and labs.  Questions were answered to the patient's satisfaction.     [name]

## 2018-07-27 NOTE — Anesthesia Procedure Notes (Signed)
Anesthesia Procedure Image    

## 2018-07-27 NOTE — Op Note (Signed)
RIGHT INGUINAL HERNIA REPAIR ERAS PATHWAY, INSERTION OF MESH  Procedure Note  Steven Austin. 07/27/2018   Pre-op Diagnosis: recurrent right inguinal hernia     Post-op Diagnosis: same  Procedure(s): RIGHT INGUINAL HERNIA REPAIR ERAS PATHWAY INSERTION OF MESH (progrip prolene mesh)  Surgeon(s): Coralie Keens, MD  Anesthesia: General  Staff:  Circulator: Ignacia Palma, RN; Bartholomew Boards, RN; Pozil, Renita Papa, RN Scrub Person: Carney Living, [place], CST  Estimated Blood Loss: Minimal               Findings: The patient was found to have an indirect recurrent right inguinal hernia  Procedure: The patient was brought to the operating room and identified as correct patient.  He had already received a tap block from anesthesia.  He was placed upon the operating room table and general anesthesia was induced.  His abdomen was then prepped and draped in usual sterile fashion.  I anesthetized skin in the right inguinal area with Marcaine.  I then made a transverse incision with a scalpel.  I carried this down through Scarpa's fascia with electrocautery.  I then opened the sternal oblique fascia toward the internal and external rings.  The testicular cord and structures were controlled with a Penrose drain.  The patient had a recurrent right indirect right inguinal hernia.  I separated the cord from the hernia sac.  I then opened the sac and all contents were reduced back to the abdominal cavity.  I tied off the base of the sac with a 2-0 silk suture.  I then excised the redundant sac.  Next a piece of pro-grip Prolene mesh was brought to the field.  I placed it as an onlay against the pubic tubercle and then brought around the cord structures.  I sutured in place with a single 2-0 Vicryl suture.  Good coverage of the inguinal floor and ring appeared to be achieved.  I then closed the external lead fascia with a running 2-0 Vicryl suture.  Scarpa's fascia was then closed  with 3-0 Vicryl sutures and the skin was closed with a running 4-0 Monocryl.  Dermabond was then applied.  The patient tolerated procedure well.  All the counts were correct at the end of the procedure.  The patient was then extubated in the operating room and taken in a stable condition to the recovery room.          Mirna Sutcliffe A   Date: 07/27/2018  Time: 11:17 AM

## 2018-07-27 NOTE — Anesthesia Procedure Notes (Signed)
Anesthesia Regional Block: TAP block   Pre-Anesthetic Checklist: ,, timeout performed, Correct Patient, Correct Site, Correct Laterality, Correct Procedure, Correct Position, site marked, Risks and benefits discussed,  Surgical consent,  Pre-op evaluation,  At surgeon's request and post-op pain management  Laterality: Right  Prep: chloraprep       Needles:  Injection technique: Single-shot  Needle Type: Echogenic Needle     Needle Length: 9cm      Additional Needles:   Procedures:,,,, ultrasound used (permanent image in chart),,,,  Narrative:  Start time: 07/27/2018 10:00 AM End time: 07/27/2018 10:12 AM Injection made incrementally with aspirations every 5 mL.  Performed by: Personally  Anesthesiologist: Myrtie Soman, MD  Additional Notes: Patient tolerated the procedure well without complications

## 2018-07-27 NOTE — Discharge Instructions (Signed)
[facility] _______[facility] ° °UMBILICAL OR INGUINAL HERNIA REPAIR: POST OP INSTRUCTIONS ° °Always review your discharge instruction sheet given to you by the facility where your surgery was performed. °IF YOU HAVE DISABILITY OR FAMILY LEAVE FORMS, YOU MUST BRING THEM TO THE OFFICE FOR PROCESSING.   °DO NOT GIVE THEM TO YOUR DOCTOR. ° °1. A  prescription for pain medication may be given to you upon discharge.  Take your pain medication as prescribed, if needed.  If narcotic pain medicine is not needed, then you may take acetaminophen (Tylenol) or ibuprofen (Advil) as needed. °2. Take your usually prescribed medications unless otherwise directed. °If you need a refill on your pain medication, please contact your pharmacy.  They will contact our office to request authorization. Prescriptions will not be filled after 5 pm or on week-ends. °3. You should follow a light diet the first 24 hours after arrival home, such as soup and crackers, etc.  Be sure to include lots of fluids daily.  Resume your normal diet the day after surgery. °4.Most patients will experience some swelling and bruising around the umbilicus or in the groin and scrotum.  Ice packs and reclining will help.  Swelling and bruising can take several days to resolve.  °6. It is common to experience some constipation if taking pain medication after surgery.  Increasing fluid intake and taking a stool softener (such as Colace) will usually help or prevent this problem from occurring.  A mild laxative (Milk of Magnesia or Miralax) should be taken according to package directions if there are no bowel movements after 48 hours. °7. Unless discharge instructions indicate otherwise, you may remove your bandages 24-48 hours after surgery, and you may shower at that time.  You may have steri-strips (small skin tapes) in place directly over the incision.  These strips should be left on the skin for 7-10 days.  If your surgeon used skin glue on the  incision, you may shower in 24 hours.  The glue will flake off over the next 2-3 weeks.  Any sutures or staples will be removed at the office during your follow-up visit. °8. ACTIVITIES:  You may resume regular (light) daily activities beginning the next day--such as daily self-care, walking, climbing stairs--gradually increasing activities as tolerated.  You may have sexual intercourse when it is comfortable.  Refrain from any heavy lifting or straining until approved by your doctor. ° °a.You may drive when you are no longer taking prescription pain medication, you can comfortably wear a seatbelt, and you can safely maneuver your car and apply brakes. °b.RETURN TO WORK:   °_____________________________________________ ° °9.You should see your doctor in the office for a follow-up appointment approximately 2-3 weeks after your surgery.  Make sure that you call for this appointment within a day or two after you arrive home to insure a convenient appointment time. °10.OTHER INSTRUCTIONS: __OK TO SHOWER STARTING TOMORROW °ICE PACK, TYLENOL, IBUPROFEN ALSO FOR PAIN °NO LIFTING MORE THAN 15 TO 20 POUNDS FOR 4 WEEKS_______________________ °   _____________________________________ ° °WHEN TO CALL YOUR DOCTOR: °1. Fever over 101.0 °2. Inability to urinate °3. Nausea and/or vomiting °4. Extreme swelling or bruising °5. Continued bleeding from incision. °6. Increased pain, redness, or drainage from the incision ° °The clinic staff is available to answer your questions during regular business hours.  Please don’t hesitate to call and ask to speak to one of the nurses for clinical concerns.  If you have a medical emergency, go to the   the nearest emergency room or call 911.  A surgeon from [facility] is always on call at the hospital   [address], [address], [place], [place]  [address] ?  [address] [address], [place], [place]   [address] [phone] ? [phone] ? FAX [phone] Web site:  [email or web address] 

## 2018-07-27 NOTE — Anesthesia Preprocedure Evaluation (Signed)
Anesthesia Evaluation  Patient identified by MRN, date of birth, ID band Patient awake    Reviewed: Allergy & Precautions, NPO status , Patient's Chart, lab work & pertinent test results  Airway Mallampati: II  TM Distance: >3 FB Neck ROM: Full    Dental no notable dental hx.    Pulmonary neg pulmonary ROS, former smoker,    Pulmonary exam normal breath sounds clear to auscultation       Cardiovascular hypertension, Normal cardiovascular exam Rhythm:Regular Rate:Normal     Neuro/Psych negative neurological ROS  negative psych ROS   GI/Hepatic negative GI ROS, Neg liver ROS,   Endo/Other  negative endocrine ROS  Renal/GU negative Renal ROS  negative genitourinary   Musculoskeletal negative musculoskeletal ROS (+)   Abdominal   Peds negative pediatric ROS (+)  Hematology negative hematology ROS (+)   Anesthesia Other Findings   Reproductive/Obstetrics negative OB ROS                             Anesthesia Physical Anesthesia Plan  ASA: II  Anesthesia Plan: General   Post-op Pain Management:  Regional for Post-op pain   Induction: Intravenous  PONV Risk Score and Plan: 2 and Ondansetron, Dexamethasone and Treatment may vary due to age or medical condition  Airway Management Planned: LMA and Oral ETT  Additional Equipment:   Intra-op Plan:   Post-operative Plan: Extubation in OR  Informed Consent: I have reviewed the patients History and Physical, chart, labs and discussed the procedure including the risks, benefits and alternatives for the proposed anesthesia with the patient or authorized representative who has indicated his/her understanding and acceptance.   Dental advisory given  Plan Discussed with: CRNA and Surgeon  Anesthesia Plan Comments:         Anesthesia Quick Evaluation

## 2018-07-27 NOTE — Transfer of Care (Signed)
Immediate Anesthesia Transfer of Care Note  Patient: Steven Austin.  Procedure(s) Performed: RIGHT INGUINAL HERNIA REPAIR ERAS PATHWAY (Right Inguinal) INSERTION OF MESH (Right Inguinal)  Patient Location: PACU  Anesthesia Type:General  Level of Consciousness: awake, alert  and oriented  Airway & Oxygen Therapy: Patient Spontanous Breathing and Patient connected to face mask oxygen  Post-op Assessment: Report given to RN and Post -op Vital signs reviewed and stable  Post vital signs: Reviewed and stable  Last Vitals:  Vitals Value Taken Time  BP 125/67 07/27/2018 11:26 AM  Temp 36.6 C 07/27/2018 11:26 AM  Pulse 78 07/27/2018 11:29 AM  Resp 13 07/27/2018 11:29 AM  SpO2 99 % 07/27/2018 11:29 AM  Vitals shown include unvalidated device data.  Last Pain:  Vitals:   07/27/18 0840  TempSrc: Oral         Complications: No apparent anesthesia complications

## 2018-07-27 NOTE — Anesthesia Procedure Notes (Signed)
Procedure Name: LMA Insertion Date/Time: 07/27/2018 10:41 AM Performed by: Garrel Ridgel, CRNA Pre-anesthesia Checklist: Patient identified, Emergency Drugs available, Suction available, Patient being monitored and Timeout performed Patient Re-evaluated:Patient Re-evaluated prior to induction Oxygen Delivery Method: Circle system utilized Preoxygenation: Pre-oxygenation with 100% oxygen Induction Type: IV induction Ventilation: Mask ventilation without difficulty LMA: LMA inserted LMA Size: 4.0 Number of attempts: 1 Placement Confirmation: positive ETCO2 and breath sounds checked- equal and bilateral Tube secured with: Tape Dental Injury: Teeth and Oropharynx as per pre-operative assessment

## 2018-07-27 NOTE — Progress Notes (Signed)
Assisted Dr. Kalman Shan with right, ultrasound guided, transabdominal plane block. Side rails up, monitors on throughout procedure. See vital signs in flow sheet. Tolerated Procedure well.

## 2018-07-28 ENCOUNTER — Encounter (HOSPITAL_COMMUNITY): Payer: Self-pay | Admitting: Surgery

## 2018-07-28 NOTE — Addendum Note (Signed)
Addendum  created 07/28/18 0753 by Lollie Sails, CRNA   Charge Capture section accepted

## 2018-07-30 NOTE — Addendum Note (Signed)
Addendum  created 07/30/18 0724 by Myrtie Soman, MD   Intraprocedure Staff edited

## 2018-08-07 DIAGNOSIS — R809 Proteinuria, unspecified: Secondary | ICD-10-CM | POA: Diagnosis not present

## 2018-08-21 ENCOUNTER — Other Ambulatory Visit: Payer: Self-pay | Admitting: Surgery

## 2018-08-28 DIAGNOSIS — Z23 Encounter for immunization: Secondary | ICD-10-CM | POA: Diagnosis not present

## 2018-12-11 DIAGNOSIS — R1031 Right lower quadrant pain: Secondary | ICD-10-CM | POA: Diagnosis not present

## 2018-12-28 DIAGNOSIS — H40013 Open angle with borderline findings, low risk, bilateral: Secondary | ICD-10-CM | POA: Diagnosis not present

## 2019-02-25 DIAGNOSIS — R809 Proteinuria, unspecified: Secondary | ICD-10-CM | POA: Diagnosis not present

## 2019-03-03 DIAGNOSIS — M545 Low back pain: Secondary | ICD-10-CM | POA: Diagnosis not present

## 2019-05-14 DIAGNOSIS — I10 Essential (primary) hypertension: Secondary | ICD-10-CM | POA: Diagnosis not present

## 2019-05-14 DIAGNOSIS — Z125 Encounter for screening for malignant neoplasm of prostate: Secondary | ICD-10-CM | POA: Diagnosis not present

## 2019-05-14 DIAGNOSIS — R634 Abnormal weight loss: Secondary | ICD-10-CM | POA: Diagnosis not present

## 2019-05-14 DIAGNOSIS — R7301 Impaired fasting glucose: Secondary | ICD-10-CM | POA: Diagnosis not present

## 2019-05-24 DIAGNOSIS — G5602 Carpal tunnel syndrome, left upper limb: Secondary | ICD-10-CM | POA: Diagnosis not present

## 2019-05-24 DIAGNOSIS — M1812 Unilateral primary osteoarthritis of first carpometacarpal joint, left hand: Secondary | ICD-10-CM | POA: Diagnosis not present

## 2019-06-28 DIAGNOSIS — H40013 Open angle with borderline findings, low risk, bilateral: Secondary | ICD-10-CM | POA: Diagnosis not present

## 2019-06-28 DIAGNOSIS — H524 Presbyopia: Secondary | ICD-10-CM | POA: Diagnosis not present

## 2019-06-28 DIAGNOSIS — H25013 Cortical age-related cataract, bilateral: Secondary | ICD-10-CM | POA: Diagnosis not present

## 2019-06-28 DIAGNOSIS — H2513 Age-related nuclear cataract, bilateral: Secondary | ICD-10-CM | POA: Diagnosis not present

## 2019-07-01 DIAGNOSIS — N400 Enlarged prostate without lower urinary tract symptoms: Secondary | ICD-10-CM | POA: Diagnosis not present

## 2019-07-01 DIAGNOSIS — I10 Essential (primary) hypertension: Secondary | ICD-10-CM | POA: Diagnosis not present

## 2019-07-01 DIAGNOSIS — R809 Proteinuria, unspecified: Secondary | ICD-10-CM | POA: Diagnosis not present

## 2019-07-01 DIAGNOSIS — E782 Mixed hyperlipidemia: Secondary | ICD-10-CM | POA: Diagnosis not present

## 2019-07-01 DIAGNOSIS — D229 Melanocytic nevi, unspecified: Secondary | ICD-10-CM | POA: Diagnosis not present

## 2019-07-01 DIAGNOSIS — R7301 Impaired fasting glucose: Secondary | ICD-10-CM | POA: Diagnosis not present

## 2019-07-01 DIAGNOSIS — Z86010 Personal history of colonic polyps: Secondary | ICD-10-CM | POA: Diagnosis not present

## 2019-07-01 DIAGNOSIS — G4730 Sleep apnea, unspecified: Secondary | ICD-10-CM | POA: Diagnosis not present

## 2019-08-02 DIAGNOSIS — D225 Melanocytic nevi of trunk: Secondary | ICD-10-CM | POA: Diagnosis not present

## 2019-08-02 DIAGNOSIS — Z1283 Encounter for screening for malignant neoplasm of skin: Secondary | ICD-10-CM | POA: Diagnosis not present

## 2019-08-11 DIAGNOSIS — Z23 Encounter for immunization: Secondary | ICD-10-CM | POA: Diagnosis not present

## 2019-08-19 DIAGNOSIS — G5602 Carpal tunnel syndrome, left upper limb: Secondary | ICD-10-CM | POA: Diagnosis not present

## 2019-08-26 ENCOUNTER — Other Ambulatory Visit: Payer: Self-pay

## 2019-08-26 DIAGNOSIS — R202 Paresthesia of skin: Secondary | ICD-10-CM

## 2019-09-09 ENCOUNTER — Other Ambulatory Visit: Payer: Self-pay

## 2019-09-09 ENCOUNTER — Ambulatory Visit (INDEPENDENT_AMBULATORY_CARE_PROVIDER_SITE_OTHER): Payer: Medicare Other | Admitting: Neurology

## 2019-09-09 DIAGNOSIS — R202 Paresthesia of skin: Secondary | ICD-10-CM | POA: Diagnosis not present

## 2019-09-09 DIAGNOSIS — G5622 Lesion of ulnar nerve, left upper limb: Secondary | ICD-10-CM

## 2019-09-09 DIAGNOSIS — G5603 Carpal tunnel syndrome, bilateral upper limbs: Secondary | ICD-10-CM

## 2019-09-09 NOTE — Procedures (Signed)
[facility]  [place], [place] [address]  [place], [place] [address] Tel: [phone] Fax:  [phone] Test Date:  09/09/2019  Patient: Steven Austin. DOB: 1946-07-17 Physician: Narda Amber, DO  Sex: Male Height: 5\' 9"  Ref Phys: Elsie Saas, MD  ID#: [id number] Temp: 33.0C Technician:    Patient Complaints: This is a 73 year old man referred for evaluation of bilateral hand paresthesias, worse on the left.  NCV & EMG Findings: Extensive electrodiagnostic testing of the left upper extremity and additional studies of the right shows:  1. Left median sensory response is absent.  Right median sensory response shows prolonged latency (4.6 ms) and reduced amplitude (7.8 V).  Bilateral ulnar sensory responses are within normal limits. 2. Left median motor response shows prolonged latency (7.8 ms) and reduced amplitude (3.2 mV).  Right median motor response shows prolonged latency (4.7 ms).  Left ulnar motor response shows slowed conduction velocity across the elbow (A Elbow-B Elbow, 36 m/s).  Right ulnar motor responses within normal limits.   3. Chronic motor axonal loss changes are seen affecting bilateral abductor pollicis brevis muscles, without accompanied active denervation.    Impression: 1. Left median neuropathy at or distal to the wrist (severe), consistent with a clinical diagnosis of carpal tunnel syndrome.   2. Right median neuropathy at or distal to the wrist (moderate), consistent with a clinical diagnosis of carpal tunnel syndrome. 3. Left ulnar neuropathy with slowing across the elbow, purely demyelinating, mild.   ___________________________ Narda Amber, DO    Nerve Conduction Studies Anti Sensory Summary Table   Site NR Peak (ms) Norm Peak (ms) P-T Amp (V) Norm P-T Amp  Left Median Anti Sensory (2nd Digit)  33C  Wrist NR  <3.8  >10  Right Median Anti Sensory (2nd Digit)  33C  Wrist    4.6 <3.8 7.8 >10  Left Ulnar Anti Sensory (5th Digit)   33C  Wrist    3.0 <3.2 10.5 >5  Right Ulnar Anti Sensory (5th Digit)  33C  Wrist    2.9 <3.2 11.3 >5   Motor Summary Table   Site NR Onset (ms) Norm Onset (ms) O-P Amp (mV) Norm O-P Amp Site1 Site2 Delta-0 (ms) Dist (cm) Vel (m/s) Norm Vel (m/s)  Left Median Motor (Abd Poll Brev)  33C  Wrist    7.8 <4.0 3.2 >5 Elbow Wrist 4.7 30.0 64 >50  Elbow    12.5  2.9         Right Median Motor (Abd Poll Brev)  33C  Wrist    4.7 <4.0 7.1 >5 Elbow Wrist 6.2 34.0 55 >50  Elbow    10.9  7.0         Left Ulnar Motor (Abd Dig Minimi)  33C  Wrist    2.0 <3.1 8.6 >7 B Elbow Wrist 4.3 27.0 63 >50  B Elbow    6.3  6.9  A Elbow B Elbow 2.8 10.0 36 >50  A Elbow    9.1  6.4         Right Ulnar Motor (Abd Dig Minimi)  33C  Wrist    2.2 <3.1 8.4 >7 B Elbow Wrist 4.4 25.0 57 >50  B Elbow    6.6  7.8  A Elbow B Elbow 1.8 10.0 56 >50  A Elbow    8.4  7.6          EMG   Side Muscle Ins Act Fibs Psw Fasc Number Recrt Dur Dur. Amp Amp.  Poly Poly. Comment  Right 1stDorInt Nml Nml Nml Nml Nml Nml Nml Nml Nml Nml Nml Nml N/A  Right Abd Poll Brev Nml Nml Nml Nml 1- Rapid Few 1+ Few 1+ Nml Nml N/A  Right PronatorTeres Nml Nml Nml Nml Nml Nml Nml Nml Nml Nml Nml Nml N/A  Right Biceps Nml Nml Nml Nml Nml Nml Nml Nml Nml Nml Nml Nml N/A  Right Triceps Nml Nml Nml Nml Nml Nml Nml Nml Nml Nml Nml Nml N/A  Right Deltoid Nml Nml Nml Nml Nml Nml Nml Nml Nml Nml Nml Nml N/A  Left 1stDorInt Nml Nml Nml Nml Nml Nml Nml Nml Nml Nml Nml Nml N/A  Left Abd Poll Brev Nml Nml Nml Nml 1- Rapid Many 1+ Many 1+ Nml Nml N/A  Left PronatorTeres Nml Nml Nml Nml Nml Nml Nml Nml Nml Nml Nml Nml N/A  Left Biceps Nml Nml Nml Nml Nml Nml Nml Nml Nml Nml Nml Nml N/A  Left Triceps Nml Nml Nml Nml Nml Nml Nml Nml Nml Nml Nml Nml N/A  Left Deltoid Nml Nml Nml Nml Nml Nml Nml Nml Nml Nml Nml Nml N/A  Left FlexCarpiUln Nml Nml Nml Nml Nml Nml Nml Nml Nml Nml Nml Nml N/A      Waveforms:

## 2019-09-21 ENCOUNTER — Encounter: Payer: Medicare Other | Admitting: Neurology

## 2019-09-28 DIAGNOSIS — G5603 Carpal tunnel syndrome, bilateral upper limbs: Secondary | ICD-10-CM | POA: Diagnosis not present

## 2019-09-28 DIAGNOSIS — G5622 Lesion of ulnar nerve, left upper limb: Secondary | ICD-10-CM | POA: Diagnosis not present

## 2019-11-17 DIAGNOSIS — G5602 Carpal tunnel syndrome, left upper limb: Secondary | ICD-10-CM | POA: Diagnosis not present

## 2019-12-28 DIAGNOSIS — H40013 Open angle with borderline findings, low risk, bilateral: Secondary | ICD-10-CM | POA: Diagnosis not present

## 2020-03-21 DIAGNOSIS — G5622 Lesion of ulnar nerve, left upper limb: Secondary | ICD-10-CM | POA: Diagnosis not present

## 2020-03-21 DIAGNOSIS — G5603 Carpal tunnel syndrome, bilateral upper limbs: Secondary | ICD-10-CM | POA: Diagnosis not present

## 2020-06-26 DIAGNOSIS — H25013 Cortical age-related cataract, bilateral: Secondary | ICD-10-CM | POA: Diagnosis not present

## 2020-06-26 DIAGNOSIS — H40023 Open angle with borderline findings, high risk, bilateral: Secondary | ICD-10-CM | POA: Diagnosis not present

## 2020-06-26 DIAGNOSIS — H524 Presbyopia: Secondary | ICD-10-CM | POA: Diagnosis not present

## 2020-06-26 DIAGNOSIS — H2513 Age-related nuclear cataract, bilateral: Secondary | ICD-10-CM | POA: Diagnosis not present

## 2020-07-04 ENCOUNTER — Other Ambulatory Visit: Payer: Self-pay

## 2020-07-04 ENCOUNTER — Other Ambulatory Visit: Payer: Medicare Other

## 2020-07-04 ENCOUNTER — Other Ambulatory Visit: Payer: Self-pay | Admitting: *Deleted

## 2020-07-04 DIAGNOSIS — Z20822 Contact with and (suspected) exposure to covid-19: Secondary | ICD-10-CM

## 2020-07-06 ENCOUNTER — Other Ambulatory Visit: Payer: Self-pay | Admitting: Family Medicine

## 2020-07-06 DIAGNOSIS — Z136 Encounter for screening for cardiovascular disorders: Secondary | ICD-10-CM

## 2020-07-06 DIAGNOSIS — Z125 Encounter for screening for malignant neoplasm of prostate: Secondary | ICD-10-CM | POA: Diagnosis not present

## 2020-07-06 DIAGNOSIS — I10 Essential (primary) hypertension: Secondary | ICD-10-CM | POA: Diagnosis not present

## 2020-07-06 DIAGNOSIS — Z87891 Personal history of nicotine dependence: Secondary | ICD-10-CM | POA: Diagnosis not present

## 2020-07-06 DIAGNOSIS — N400 Enlarged prostate without lower urinary tract symptoms: Secondary | ICD-10-CM | POA: Diagnosis not present

## 2020-07-06 DIAGNOSIS — Z86010 Personal history of colonic polyps: Secondary | ICD-10-CM | POA: Diagnosis not present

## 2020-07-06 DIAGNOSIS — E782 Mixed hyperlipidemia: Secondary | ICD-10-CM | POA: Diagnosis not present

## 2020-07-06 DIAGNOSIS — R7301 Impaired fasting glucose: Secondary | ICD-10-CM | POA: Diagnosis not present

## 2020-07-06 DIAGNOSIS — G4730 Sleep apnea, unspecified: Secondary | ICD-10-CM | POA: Diagnosis not present

## 2020-07-06 DIAGNOSIS — Z1159 Encounter for screening for other viral diseases: Secondary | ICD-10-CM | POA: Diagnosis not present

## 2020-07-06 DIAGNOSIS — Z0000 Encounter for general adult medical examination without abnormal findings: Secondary | ICD-10-CM | POA: Diagnosis not present

## 2020-07-06 DIAGNOSIS — R809 Proteinuria, unspecified: Secondary | ICD-10-CM | POA: Diagnosis not present

## 2020-07-06 DIAGNOSIS — D229 Melanocytic nevi, unspecified: Secondary | ICD-10-CM | POA: Diagnosis not present

## 2020-07-06 LAB — NOVEL CORONAVIRUS, NAA: SARS-CoV-2, NAA: NOT DETECTED

## 2020-07-18 ENCOUNTER — Ambulatory Visit: Payer: Medicare Other

## 2020-07-18 ENCOUNTER — Ambulatory Visit
Admission: RE | Admit: 2020-07-18 | Discharge: 2020-07-18 | Disposition: A | Discharge status: Home / Self Care | Payer: Medicare Other | Source: Ambulatory Visit | Attending: Family Medicine | Admitting: Family Medicine

## 2020-07-18 DIAGNOSIS — Z87891 Personal history of nicotine dependence: Secondary | ICD-10-CM | POA: Diagnosis not present

## 2020-07-18 DIAGNOSIS — Z136 Encounter for screening for cardiovascular disorders: Secondary | ICD-10-CM

## 2020-07-25 DIAGNOSIS — R809 Proteinuria, unspecified: Secondary | ICD-10-CM | POA: Diagnosis not present

## 2020-07-31 DIAGNOSIS — L821 Other seborrheic keratosis: Secondary | ICD-10-CM | POA: Diagnosis not present

## 2020-07-31 DIAGNOSIS — D034 Melanoma in situ of scalp and neck: Secondary | ICD-10-CM | POA: Diagnosis not present

## 2020-07-31 DIAGNOSIS — D225 Melanocytic nevi of trunk: Secondary | ICD-10-CM | POA: Diagnosis not present

## 2020-07-31 DIAGNOSIS — B351 Tinea unguium: Secondary | ICD-10-CM | POA: Diagnosis not present

## 2020-08-01 DIAGNOSIS — Z23 Encounter for immunization: Secondary | ICD-10-CM | POA: Diagnosis not present

## 2020-08-15 DIAGNOSIS — Z23 Encounter for immunization: Secondary | ICD-10-CM | POA: Diagnosis not present

## 2020-08-16 DIAGNOSIS — D034 Melanoma in situ of scalp and neck: Secondary | ICD-10-CM | POA: Diagnosis not present

## 2020-08-16 DIAGNOSIS — L988 Other specified disorders of the skin and subcutaneous tissue: Secondary | ICD-10-CM | POA: Diagnosis not present

## 2020-10-17 DIAGNOSIS — H40023 Open angle with borderline findings, high risk, bilateral: Secondary | ICD-10-CM | POA: Diagnosis not present

## 2020-11-08 DIAGNOSIS — D225 Melanocytic nevi of trunk: Secondary | ICD-10-CM | POA: Diagnosis not present

## 2020-11-08 DIAGNOSIS — Z08 Encounter for follow-up examination after completed treatment for malignant neoplasm: Secondary | ICD-10-CM | POA: Diagnosis not present

## 2020-11-08 DIAGNOSIS — D485 Neoplasm of uncertain behavior of skin: Secondary | ICD-10-CM | POA: Diagnosis not present

## 2020-11-08 DIAGNOSIS — Z85820 Personal history of malignant melanoma of skin: Secondary | ICD-10-CM | POA: Diagnosis not present

## 2020-11-08 DIAGNOSIS — L821 Other seborrheic keratosis: Secondary | ICD-10-CM | POA: Diagnosis not present

## 2021-01-17 DIAGNOSIS — H40023 Open angle with borderline findings, high risk, bilateral: Secondary | ICD-10-CM | POA: Diagnosis not present

## 2021-02-14 DIAGNOSIS — B351 Tinea unguium: Secondary | ICD-10-CM | POA: Diagnosis not present

## 2021-02-14 DIAGNOSIS — D225 Melanocytic nevi of trunk: Secondary | ICD-10-CM | POA: Diagnosis not present

## 2021-02-14 DIAGNOSIS — Z08 Encounter for follow-up examination after completed treatment for malignant neoplasm: Secondary | ICD-10-CM | POA: Diagnosis not present

## 2021-02-14 DIAGNOSIS — Z85820 Personal history of malignant melanoma of skin: Secondary | ICD-10-CM | POA: Diagnosis not present

## 2021-02-14 DIAGNOSIS — Z1283 Encounter for screening for malignant neoplasm of skin: Secondary | ICD-10-CM | POA: Diagnosis not present

## 2021-03-23 DIAGNOSIS — Z23 Encounter for immunization: Secondary | ICD-10-CM | POA: Diagnosis not present

## 2021-05-16 DIAGNOSIS — D2271 Melanocytic nevi of right lower limb, including hip: Secondary | ICD-10-CM | POA: Diagnosis not present

## 2021-05-16 DIAGNOSIS — Z85820 Personal history of malignant melanoma of skin: Secondary | ICD-10-CM | POA: Diagnosis not present

## 2021-05-16 DIAGNOSIS — Z08 Encounter for follow-up examination after completed treatment for malignant neoplasm: Secondary | ICD-10-CM | POA: Diagnosis not present

## 2021-05-16 DIAGNOSIS — L570 Actinic keratosis: Secondary | ICD-10-CM | POA: Diagnosis not present

## 2021-05-16 DIAGNOSIS — X32XXXD Exposure to sunlight, subsequent encounter: Secondary | ICD-10-CM | POA: Diagnosis not present

## 2021-05-16 DIAGNOSIS — D485 Neoplasm of uncertain behavior of skin: Secondary | ICD-10-CM | POA: Diagnosis not present

## 2021-05-16 DIAGNOSIS — L905 Scar conditions and fibrosis of skin: Secondary | ICD-10-CM | POA: Diagnosis not present

## 2021-05-16 DIAGNOSIS — Z1283 Encounter for screening for malignant neoplasm of skin: Secondary | ICD-10-CM | POA: Diagnosis not present

## 2021-06-16 DIAGNOSIS — U071 COVID-19: Secondary | ICD-10-CM | POA: Diagnosis not present

## 2021-07-25 DIAGNOSIS — H2513 Age-related nuclear cataract, bilateral: Secondary | ICD-10-CM | POA: Diagnosis not present

## 2021-07-25 DIAGNOSIS — H40023 Open angle with borderline findings, high risk, bilateral: Secondary | ICD-10-CM | POA: Diagnosis not present

## 2021-07-25 DIAGNOSIS — H25013 Cortical age-related cataract, bilateral: Secondary | ICD-10-CM | POA: Diagnosis not present

## 2021-07-25 DIAGNOSIS — H524 Presbyopia: Secondary | ICD-10-CM | POA: Diagnosis not present

## 2021-07-27 DIAGNOSIS — Z0000 Encounter for general adult medical examination without abnormal findings: Secondary | ICD-10-CM | POA: Diagnosis not present

## 2021-07-27 DIAGNOSIS — Z125 Encounter for screening for malignant neoplasm of prostate: Secondary | ICD-10-CM | POA: Diagnosis not present

## 2021-07-27 DIAGNOSIS — R7301 Impaired fasting glucose: Secondary | ICD-10-CM | POA: Diagnosis not present

## 2021-07-27 DIAGNOSIS — R809 Proteinuria, unspecified: Secondary | ICD-10-CM | POA: Diagnosis not present

## 2021-07-27 DIAGNOSIS — E782 Mixed hyperlipidemia: Secondary | ICD-10-CM | POA: Diagnosis not present

## 2021-07-27 DIAGNOSIS — I10 Essential (primary) hypertension: Secondary | ICD-10-CM | POA: Diagnosis not present

## 2021-07-27 DIAGNOSIS — N400 Enlarged prostate without lower urinary tract symptoms: Secondary | ICD-10-CM | POA: Diagnosis not present

## 2021-07-30 DIAGNOSIS — Z1389 Encounter for screening for other disorder: Secondary | ICD-10-CM | POA: Diagnosis not present

## 2021-07-30 DIAGNOSIS — Z0000 Encounter for general adult medical examination without abnormal findings: Secondary | ICD-10-CM | POA: Diagnosis not present

## 2021-08-01 DIAGNOSIS — I10 Essential (primary) hypertension: Secondary | ICD-10-CM | POA: Diagnosis not present

## 2021-08-01 DIAGNOSIS — E782 Mixed hyperlipidemia: Secondary | ICD-10-CM | POA: Diagnosis not present

## 2021-08-15 DIAGNOSIS — X32XXXD Exposure to sunlight, subsequent encounter: Secondary | ICD-10-CM | POA: Diagnosis not present

## 2021-08-15 DIAGNOSIS — Z85820 Personal history of malignant melanoma of skin: Secondary | ICD-10-CM | POA: Diagnosis not present

## 2021-08-15 DIAGNOSIS — Z08 Encounter for follow-up examination after completed treatment for malignant neoplasm: Secondary | ICD-10-CM | POA: Diagnosis not present

## 2021-08-15 DIAGNOSIS — Z1283 Encounter for screening for malignant neoplasm of skin: Secondary | ICD-10-CM | POA: Diagnosis not present

## 2021-08-15 DIAGNOSIS — L570 Actinic keratosis: Secondary | ICD-10-CM | POA: Diagnosis not present

## 2021-08-24 DIAGNOSIS — Z23 Encounter for immunization: Secondary | ICD-10-CM | POA: Diagnosis not present

## 2021-08-30 DIAGNOSIS — Z23 Encounter for immunization: Secondary | ICD-10-CM | POA: Diagnosis not present

## 2021-09-11 DIAGNOSIS — U071 COVID-19: Secondary | ICD-10-CM | POA: Diagnosis not present

## 2021-11-14 DIAGNOSIS — Z08 Encounter for follow-up examination after completed treatment for malignant neoplasm: Secondary | ICD-10-CM | POA: Diagnosis not present

## 2021-11-14 DIAGNOSIS — Z1283 Encounter for screening for malignant neoplasm of skin: Secondary | ICD-10-CM | POA: Diagnosis not present

## 2021-11-14 DIAGNOSIS — Z86006 Personal history of melanoma in-situ: Secondary | ICD-10-CM | POA: Diagnosis not present

## 2021-11-14 DIAGNOSIS — L821 Other seborrheic keratosis: Secondary | ICD-10-CM | POA: Diagnosis not present

## 2021-11-14 DIAGNOSIS — L720 Epidermal cyst: Secondary | ICD-10-CM | POA: Diagnosis not present

## 2021-11-14 DIAGNOSIS — C44519 Basal cell carcinoma of skin of other part of trunk: Secondary | ICD-10-CM | POA: Diagnosis not present

## 2021-12-18 DIAGNOSIS — Z85828 Personal history of other malignant neoplasm of skin: Secondary | ICD-10-CM | POA: Diagnosis not present

## 2021-12-18 DIAGNOSIS — L570 Actinic keratosis: Secondary | ICD-10-CM | POA: Diagnosis not present

## 2021-12-18 DIAGNOSIS — Z08 Encounter for follow-up examination after completed treatment for malignant neoplasm: Secondary | ICD-10-CM | POA: Diagnosis not present

## 2021-12-18 DIAGNOSIS — L820 Inflamed seborrheic keratosis: Secondary | ICD-10-CM | POA: Diagnosis not present

## 2021-12-18 DIAGNOSIS — X32XXXD Exposure to sunlight, subsequent encounter: Secondary | ICD-10-CM | POA: Diagnosis not present

## 2022-01-22 DIAGNOSIS — H40023 Open angle with borderline findings, high risk, bilateral: Secondary | ICD-10-CM | POA: Diagnosis not present

## 2022-01-30 DIAGNOSIS — I10 Essential (primary) hypertension: Secondary | ICD-10-CM | POA: Diagnosis not present

## 2022-02-21 DIAGNOSIS — M545 Low back pain, unspecified: Secondary | ICD-10-CM | POA: Diagnosis not present

## 2022-02-27 DIAGNOSIS — X32XXXD Exposure to sunlight, subsequent encounter: Secondary | ICD-10-CM | POA: Diagnosis not present

## 2022-02-27 DIAGNOSIS — Z85820 Personal history of malignant melanoma of skin: Secondary | ICD-10-CM | POA: Diagnosis not present

## 2022-02-27 DIAGNOSIS — L570 Actinic keratosis: Secondary | ICD-10-CM | POA: Diagnosis not present

## 2022-02-27 DIAGNOSIS — Z08 Encounter for follow-up examination after completed treatment for malignant neoplasm: Secondary | ICD-10-CM | POA: Diagnosis not present

## 2022-03-01 DIAGNOSIS — S32010A Wedge compression fracture of first lumbar vertebra, initial encounter for closed fracture: Secondary | ICD-10-CM | POA: Diagnosis not present

## 2022-05-22 DIAGNOSIS — Z85820 Personal history of malignant melanoma of skin: Secondary | ICD-10-CM | POA: Diagnosis not present

## 2022-05-22 DIAGNOSIS — D1801 Hemangioma of skin and subcutaneous tissue: Secondary | ICD-10-CM | POA: Diagnosis not present

## 2022-05-22 DIAGNOSIS — Z08 Encounter for follow-up examination after completed treatment for malignant neoplasm: Secondary | ICD-10-CM | POA: Diagnosis not present

## 2022-05-22 DIAGNOSIS — D225 Melanocytic nevi of trunk: Secondary | ICD-10-CM | POA: Diagnosis not present

## 2022-05-22 DIAGNOSIS — L821 Other seborrheic keratosis: Secondary | ICD-10-CM | POA: Diagnosis not present

## 2022-07-29 DIAGNOSIS — M1711 Unilateral primary osteoarthritis, right knee: Secondary | ICD-10-CM | POA: Diagnosis not present

## 2022-07-31 DIAGNOSIS — Z125 Encounter for screening for malignant neoplasm of prostate: Secondary | ICD-10-CM | POA: Diagnosis not present

## 2022-07-31 DIAGNOSIS — E782 Mixed hyperlipidemia: Secondary | ICD-10-CM | POA: Diagnosis not present

## 2022-07-31 DIAGNOSIS — I10 Essential (primary) hypertension: Secondary | ICD-10-CM | POA: Diagnosis not present

## 2022-07-31 DIAGNOSIS — R809 Proteinuria, unspecified: Secondary | ICD-10-CM | POA: Diagnosis not present

## 2022-07-31 DIAGNOSIS — Z0000 Encounter for general adult medical examination without abnormal findings: Secondary | ICD-10-CM | POA: Diagnosis not present

## 2022-07-31 DIAGNOSIS — R7301 Impaired fasting glucose: Secondary | ICD-10-CM | POA: Diagnosis not present

## 2022-07-31 DIAGNOSIS — N400 Enlarged prostate without lower urinary tract symptoms: Secondary | ICD-10-CM | POA: Diagnosis not present

## 2022-08-05 DIAGNOSIS — R809 Proteinuria, unspecified: Secondary | ICD-10-CM | POA: Diagnosis not present

## 2022-08-05 DIAGNOSIS — E782 Mixed hyperlipidemia: Secondary | ICD-10-CM | POA: Diagnosis not present

## 2022-08-05 DIAGNOSIS — Z6828 Body mass index (BMI) 28.0-28.9, adult: Secondary | ICD-10-CM | POA: Diagnosis not present

## 2022-08-05 DIAGNOSIS — Z0000 Encounter for general adult medical examination without abnormal findings: Secondary | ICD-10-CM | POA: Diagnosis not present

## 2022-08-05 DIAGNOSIS — I10 Essential (primary) hypertension: Secondary | ICD-10-CM | POA: Diagnosis not present

## 2022-08-05 DIAGNOSIS — Z1389 Encounter for screening for other disorder: Secondary | ICD-10-CM | POA: Diagnosis not present

## 2022-08-06 DIAGNOSIS — H5202 Hypermetropia, left eye: Secondary | ICD-10-CM | POA: Diagnosis not present

## 2022-08-06 DIAGNOSIS — R809 Proteinuria, unspecified: Secondary | ICD-10-CM | POA: Diagnosis not present

## 2022-08-06 DIAGNOSIS — H40023 Open angle with borderline findings, high risk, bilateral: Secondary | ICD-10-CM | POA: Diagnosis not present

## 2022-08-06 DIAGNOSIS — H524 Presbyopia: Secondary | ICD-10-CM | POA: Diagnosis not present

## 2022-08-06 DIAGNOSIS — H25013 Cortical age-related cataract, bilateral: Secondary | ICD-10-CM | POA: Diagnosis not present

## 2022-08-06 DIAGNOSIS — I10 Essential (primary) hypertension: Secondary | ICD-10-CM | POA: Diagnosis not present

## 2022-08-06 DIAGNOSIS — H2513 Age-related nuclear cataract, bilateral: Secondary | ICD-10-CM | POA: Diagnosis not present

## 2022-08-06 DIAGNOSIS — H04123 Dry eye syndrome of bilateral lacrimal glands: Secondary | ICD-10-CM | POA: Diagnosis not present

## 2022-08-13 DIAGNOSIS — Z23 Encounter for immunization: Secondary | ICD-10-CM | POA: Diagnosis not present

## 2022-08-20 DIAGNOSIS — Z23 Encounter for immunization: Secondary | ICD-10-CM | POA: Diagnosis not present

## 2022-08-21 DIAGNOSIS — Z1283 Encounter for screening for malignant neoplasm of skin: Secondary | ICD-10-CM | POA: Diagnosis not present

## 2022-08-21 DIAGNOSIS — L905 Scar conditions and fibrosis of skin: Secondary | ICD-10-CM | POA: Diagnosis not present

## 2022-08-21 DIAGNOSIS — Z85820 Personal history of malignant melanoma of skin: Secondary | ICD-10-CM | POA: Diagnosis not present

## 2022-08-21 DIAGNOSIS — D485 Neoplasm of uncertain behavior of skin: Secondary | ICD-10-CM | POA: Diagnosis not present

## 2022-08-21 DIAGNOSIS — Z08 Encounter for follow-up examination after completed treatment for malignant neoplasm: Secondary | ICD-10-CM | POA: Diagnosis not present

## 2022-08-21 DIAGNOSIS — L570 Actinic keratosis: Secondary | ICD-10-CM | POA: Diagnosis not present

## 2022-08-21 DIAGNOSIS — D225 Melanocytic nevi of trunk: Secondary | ICD-10-CM | POA: Diagnosis not present

## 2022-08-21 DIAGNOSIS — X32XXXD Exposure to sunlight, subsequent encounter: Secondary | ICD-10-CM | POA: Diagnosis not present

## 2022-09-02 DIAGNOSIS — M1711 Unilateral primary osteoarthritis, right knee: Secondary | ICD-10-CM | POA: Diagnosis not present

## 2022-09-17 DIAGNOSIS — L988 Other specified disorders of the skin and subcutaneous tissue: Secondary | ICD-10-CM | POA: Diagnosis not present

## 2022-09-17 DIAGNOSIS — D485 Neoplasm of uncertain behavior of skin: Secondary | ICD-10-CM | POA: Diagnosis not present

## 2022-10-30 DIAGNOSIS — M1711 Unilateral primary osteoarthritis, right knee: Secondary | ICD-10-CM | POA: Diagnosis not present

## 2022-11-06 NOTE — Patient Instructions (Addendum)
SURGICAL WAITING ROOM VISITATION Patients having surgery or a procedure may have no more than 2 support people in the waiting area - these visitors may rotate.    If the patient needs to stay at the hospital during part of their recovery, the visitor guidelines for inpatient rooms apply. Pre-op nurse will coordinate an appropriate time for 1 support person to accompany patient in pre-op.  This support person may not rotate.    Please refer to the [facility] website for the visitor guidelines for Inpatients (after your surgery is over and you are in a regular room).   Due to an increase in RSV and influenza rates and associated hospitalizations, children ages 84 and under may not visit patients in [place].     Your procedure is scheduled on: 11-19-22   Report to [facility] Main Entrance    Report to admitting at 8:00 AM   Call this number if you have problems the morning of surgery [phone]   Do not eat food :After Midnight.   After Midnight you may have the following liquids until 7:30 AM DAY OF SURGERY  Water Non-Citrus Juices (without pulp, NO RED) Carbonated Beverages Black Coffee (NO MILK/CREAM OR CREAMERS, sugar ok)  Clear Tea (NO MILK/CREAM OR CREAMERS, sugar ok) regular and decaf                             Plain Jell-O (NO RED)                                           Fruit ices (not with fruit pulp, NO RED)                                     Popsicles (NO RED)                                                               Sports drinks like Gatorade (NO RED)                   The day of surgery:  Drink ONE (1) Pre-Surgery Clear Ensure at 7:30 AM the morning of surgery. Drink in one sitting. Do not sip.  This drink was given to you during your hospital  pre-op appointment visit. Nothing else to drink after completing the Pre-Surgery Clear Ensure           If you have questions, please contact your surgeon's office.   FOLLOW  ANY  ADDITIONAL PRE OP INSTRUCTIONS YOU RECEIVED FROM YOUR SURGEON'S OFFICE!!!     Oral Hygiene is also important to reduce your risk of infection.                                    Remember - BRUSH YOUR TEETH THE MORNING OF SURGERY WITH YOUR REGULAR TOOTHPASTE   Do NOT smoke after Midnight   Take these medicines the morning of surgery with A SIP OF WATER:  Atorvastatin  Gemfibrozil  Nifedipine  Okay to use eyedrops                              You may not have any metal on your body including  jewelry, and body piercing             Do not wear  lotions, powders, cologne, or deodorant              Men may shave face and neck.   Do not bring valuables to the hospital. [place].   Contacts, dentures or bridgework may not be worn into surgery.   Bring a small overnight bag day of surgery  DO NOT [place]. PHARMACY WILL DISPENSE MEDICATIONS LISTED ON YOUR MEDICATION LIST TO YOU DURING YOUR ADMISSION [place]!    Patients discharged on the day of surgery will not be allowed to drive home.  Someone NEEDS to stay with you for the first 24 hours after anesthesia.              Please read over the following fact sheets you were given: IF [place]  If you received a COVID test during your pre-op visit  it is requested that you wear a mask when out in public, stay away from anyone that may not be feeling well and notify your surgeon if you develop symptoms. If you test positive for Covid or have been in contact with anyone that has tested positive in the last 10 days please notify you surgeon.  [place] - Preparing for Surgery Before surgery, you can play an important role.  Because skin is not sterile, your skin needs to be as free of germs as possible.  You can reduce the number of germs on your skin by washing with CHG (chlorahexidine  gluconate) soap before surgery.  CHG is an antiseptic cleaner which kills germs and bonds with the skin to continue killing germs even after washing. Please DO NOT use if you have an allergy to CHG or antibacterial soaps.  If your skin becomes reddened/irritated stop using the CHG and inform your nurse when you arrive at Short Stay. Do not shave (including legs and underarms) for at least 48 hours prior to the first CHG shower.  You may shave your face/neck.  Please follow these instructions carefully:  1.  Shower with CHG Soap the night before surgery and the  morning of surgery.  2.  If you choose to wash your hair, wash your hair first as usual with your normal  shampoo.  3.  After you shampoo, rinse your hair and body thoroughly to remove the shampoo.                             4.  Use CHG as you would any other liquid soap.  You can apply chg directly to the skin and wash.  Gently with a scrungie or clean washcloth.  5.  Apply the CHG Soap to your body ONLY FROM THE NECK DOWN.   Do   not use on face/ open                           Wound or open sores.  Avoid contact with eyes, ears mouth and   genitals (private parts).                       Wash face,  Genitals (private parts) with your normal soap.             6.  Wash thoroughly, paying special attention to the area where your    surgery  will be performed.  7.  Thoroughly rinse your body with warm water from the neck down.  8.  DO NOT shower/wash with your normal soap after using and rinsing off the CHG Soap.                9.  Pat yourself dry with a clean towel.            10.  Wear clean pajamas.            11.  Place clean sheets on your bed the night of your first shower and do not  sleep with pets. Day of Surgery : Do not apply any lotions/deodorants the morning of surgery.  Please wear clean clothes to the hospital/surgery center.  FAILURE TO FOLLOW THESE INSTRUCTIONS MAY RESULT IN THE CANCELLATION OF YOUR SURGERY  PATIENT  SIGNATURE_________________________________  NURSE SIGNATURE__________________________________  ________________________________________________________________________    Steven Austin  An incentive spirometer is a tool that can help keep your lungs clear and active. This tool measures how well you are filling your lungs with each breath. Taking long deep breaths may help reverse or decrease the chance of developing breathing (pulmonary) problems (especially infection) following: A long period of time when you are unable to move or be active. BEFORE THE PROCEDURE  If the spirometer includes an indicator to show your best effort, your nurse or respiratory therapist will set it to a desired goal. If possible, sit up straight or lean slightly forward. Try not to slouch. Hold the incentive spirometer in an upright position. INSTRUCTIONS FOR USE  Sit on the edge of your bed if possible, or sit up as far as you can in bed or on a chair. Hold the incentive spirometer in an upright position. Breathe out normally. Place the mouthpiece in your mouth and seal your lips tightly around it. Breathe in slowly and as deeply as possible, raising the piston or the ball toward the top of the column. Hold your breath for 3-5 seconds or for as long as possible. Allow the piston or ball to fall to the bottom of the column. Remove the mouthpiece from your mouth and breathe out normally. Rest for a few seconds and repeat Steps 1 through 7 at least 10 times every 1-2 hours when you are awake. Take your time and take a few normal breaths between deep breaths. The spirometer may include an indicator to show your best effort. Use the indicator as a goal to work toward during each repetition. After each set of 10 deep breaths, practice coughing to be sure your lungs are clear. If you have an incision (the cut made at the time of surgery), support your incision when coughing by placing a pillow or rolled up towels  firmly against it. Once you are able to get out of bed, walk around indoors and cough well. You may stop using the incentive spirometer when instructed by your caregiver.  RISKS AND COMPLICATIONS Take your time so you do not get dizzy or light-headed. If you are in pain, you may need to  take or ask for pain medication before doing incentive spirometry. It is harder to take a deep breath if you are having pain. AFTER USE Rest and breathe slowly and easily. It can be helpful to keep track of a log of your progress. Your caregiver can provide you with a simple table to help with this. If you are using the spirometer at home, follow these instructions: [place] IF:  You are having difficultly using the spirometer. You have trouble using the spirometer as often as instructed. Your pain medication is not giving enough relief while using the spirometer. You develop fever of 100.5 F (38.1 C) or higher. SEEK IMMEDIATE MEDICAL CARE IF:  You cough up bloody sputum that had not been present before. You develop fever of 102 F (38.9 C) or greater. You develop worsening pain at or near the incision site. MAKE SURE YOU:  Understand these instructions. Will watch your condition. Will get help right away if you are not doing well or get worse. Document Released: 03/03/2007 Document Revised: 01/13/2012 Document Reviewed: 05/04/2007 [facility] Patient Information 2014 [place], [place].   ________________________________________________________________________

## 2022-11-08 ENCOUNTER — Encounter (HOSPITAL_COMMUNITY)
Admission: RE | Admit: 2022-11-08 | Discharge: 2022-11-08 | Disposition: A | Discharge status: Home / Self Care | Payer: Medicare Other | Source: Ambulatory Visit | Attending: Orthopedic Surgery | Admitting: Orthopedic Surgery

## 2022-11-08 ENCOUNTER — Other Ambulatory Visit: Payer: Self-pay

## 2022-11-08 ENCOUNTER — Encounter (HOSPITAL_COMMUNITY): Payer: Self-pay

## 2022-11-08 VITALS — BP 140/94 | HR 92 | Temp 98.4°F | Resp 12 | Ht 69.0 in | Wt 180.6 lb

## 2022-11-08 DIAGNOSIS — I444 Left anterior fascicular block: Secondary | ICD-10-CM | POA: Insufficient documentation

## 2022-11-08 DIAGNOSIS — I2510 Atherosclerotic heart disease of native coronary artery without angina pectoris: Secondary | ICD-10-CM

## 2022-11-08 DIAGNOSIS — Z01818 Encounter for other preprocedural examination: Secondary | ICD-10-CM | POA: Insufficient documentation

## 2022-11-08 HISTORY — DX: Basal cell carcinoma of skin, unspecified: C44.91

## 2022-11-08 LAB — BASIC METABOLIC PANEL
Anion gap: 10 (ref 5–15)
BUN: 13 mg/dL (ref 8–23)
CO2: 25 mmol/L (ref 22–32)
Calcium: 9.5 mg/dL (ref 8.9–10.3)
Chloride: 103 mmol/L (ref 98–111)
Creatinine, Ser: 0.89 mg/dL (ref 0.61–1.24)
GFR, Estimated: >60 mL/min (ref >60)
Glucose, Bld: 104 mg/dL — ABNORMAL HIGH (ref 70–99)
Potassium: 4 mmol/L (ref 3.5–5.1)
Sodium: 138 mmol/L (ref 135–145)

## 2022-11-08 LAB — SURGICAL PCR SCREEN
MRSA, PCR: NEGATIVE
Staphylococcus aureus: NEGATIVE

## 2022-11-08 LAB — CBC
HCT: 44.8 % (ref 39.0–52.0)
Hemoglobin: 14.7 g/dL (ref 13.0–17.0)
MCH: 32 pg (ref 26.0–34.0)
MCHC: 32.8 g/dL (ref 30.0–36.0)
MCV: 97.4 fL (ref 80.0–100.0)
Platelets: 210 10*3/uL (ref 150–400)
RBC: 4.6 MIL/uL (ref 4.22–5.81)
RDW: 12.7 % (ref 11.5–15.5)
WBC: 6.4 10*3/uL (ref 4.0–10.5)
nRBC: 0 % (ref 0.0–0.2)

## 2022-11-08 NOTE — Progress Notes (Addendum)
COVID Vaccine Completed:  Yes  Date of COVID positive in last 90 days:  No  PCP - Jillyn Ledger, FNP Cardiologist - N/A  Chest x-ray -  N/A EKG - 11-08-22 Epic Stress Test -  N/A ECHO -  N/A Cardiac Cath -  N/A Pacemaker/ICD device last checked: Spinal Cord Stimulator: N/A  Bowel Prep -  N/A  Sleep Study - Yes, mild sleep apnea CPAP - N/A  Fasting Blood Sugar -  N/A Checks Blood Sugar _____ times a day  Last dose of GLP1 agonist-  N/A GLP1 instructions:  N/A   Last dose of SGLT-2 inhibitors-  N/A SGLT-2 instructions: N/A   Blood Thinner Instructions:  N/A Aspirin Instructions: Last Dose:  Activity level:  Can go up a flight of stairs and perform activities of daily living without stopping and without symptoms of chest pain or shortness of breath.  Anesthesia review: Poor R wave progression on EKG  Patient denies shortness of breath, fever, cough and chest pain at PAT appointment  Patient verbalized understanding of instructions that were given to them at the PAT appointment. Patient was also instructed that they will need to review over the PAT instructions again at home before surgery.

## 2022-11-17 NOTE — H&P (Signed)
KNEE ARTHROPLASTY ADMISSION H&P  Patient ID: Steven Austin. MRN: [id number] DOB/AGE: 1946-01-18 77 y.o.  Chief Complaint: right knee pain.  Planned Procedure Date: 11/19/22 Medical Clearance by Genelle Bal, FNP   HPI: Steven Dobler. is a 77 y.o. male who presents for evaluation of right knee djd. The patient has a history of pain and functional disability in the right knee due to arthritis and has failed non-surgical conservative treatments for greater than 12 weeks to include NSAID's and/or analgesics, corticosteriod injections, and activity modification.  Onset of symptoms was gradual, starting 3 years ago with gradually worsening course since that time. The patient noted no past surgery on the right knee.  Patient currently rates pain at 3 out of 10 with activity. Patient has worsening of pain with activity and weight bearing and pain that interferes with activities of daily living.  Patient has evidence of joint space narrowing by imaging studies.  There is no active infection.  Past Medical History:  Diagnosis Date   Abnormal EKG    LAFB, poor r wave progression   Arthritis    "knees" (01/24/2014)   Basal cell carcinoma    Complication of anesthesia    "bothered by ether in the [place]"    Diverticulosis    DJD (degenerative joint disease)    Hearing loss    History of colon polyps    History of irregular heartbeat    Hyperlipidemia    Hypertension    Left knee DJD    Sleep apnea    mild, sleep positioning, CPAP not ordered   Past Surgical History:  Procedure Laterality Date   COLONOSCOPY W/ POLYPECTOMY     FEMUR FRACTURE SURGERY Right 1959   "broke it playing football"   INGUINAL HERNIA REPAIR Bilateral 03/25/2014   Procedure: LAPAROSCOPIC BILATERAL INGUINAL HERNIA REPAIR;  Surgeon: Harl Bowie, MD;  Location: [place];  Service: General;  Laterality: Bilateral;   INGUINAL HERNIA REPAIR Right 07/27/2018   Procedure: RIGHT INGUINAL HERNIA REPAIR ERAS  PATHWAY;  Surgeon: Coralie Keens, MD;  Location: [facility];  Service: General;  Laterality: Right;  TAP BLOCK   INSERTION OF MESH Bilateral 03/25/2014   Procedure: INSERTION OF MESH;  Surgeon: Harl Bowie, MD;  Location: [place];  Service: General;  Laterality: Bilateral;   INSERTION OF MESH Right 07/27/2018   Procedure: INSERTION OF MESH;  Surgeon: Coralie Keens, MD;  Location: [facility];  Service: General;  Laterality: Right;   MOUTH SURGERY     ROTATOR CUFF REPAIR Right 2015   TOTAL KNEE ARTHROPLASTY Left 01/24/2014   Procedure: LEFT TOTAL KNEE ARTHROPLASTY;  Surgeon: Lorn Junes, MD;  Location: [facility];  Service: Orthopedics;  Laterality: Left;   Allergies  Allergen Reactions   Aspirin     Per Dr was told not to take    Augmentin [Amoxicillin-Pot Clavulanate] Rash    Has patient had a PCN reaction causing immediate rash, facial/tongue/throat swelling, SOB or lightheadedness with hypotension: No Has patient had a PCN reaction causing severe rash involving mucus membranes or skin necrosis: No Has patient had a PCN reaction that required hospitalization: No Has patient had a PCN reaction occurring within the last 10 years: No If all of the above answers are "NO", then may proceed with Cephalosporin use.     Prior to Admission medications   Medication Sig Start Date End Date Taking? Authorizing Provider  atorvastatin (LIPITOR) 20 MG tablet Take 20 mg by mouth daily.   Yes  [provider]  gemfibrozil (LOPID) 600 MG tablet Take 600 mg by mouth 2 (two) times daily. 07/10/18  Yes [provider]  latanoprost (XALATAN) 0.005 % ophthalmic solution Place 1 drop into both eyes at bedtime. 06/27/18  Yes [provider]  losartan (COZAAR) 50 MG tablet Take 50 mg by mouth daily.   Yes [provider]  Misc Natural Products (GLUCOSAMINE CHONDROITIN TRIPLE PO) Take 1 tablet by mouth daily.   Yes [provider]  Multiple Vitamin (MULTIVITAMIN WITH  MINERALS) TABS tablet Take 1 tablet by mouth daily.   Yes [provider]  NIFEdipine (PROCARDIA-XL/ADALAT CC) 60 MG 24 hr tablet Take 60 mg by mouth daily.   Yes [provider]  TERBINAFINE HCL EX Apply 1 drop topically at bedtime. Compounded medication, apply to affected toes at bedtime   Yes [provider]   Social History   Socioeconomic History   Marital status: Married    Spouse name: Not on file   Number of children: Not on file   Years of education: Not on file   Highest education level: Not on file  Occupational History   Not on file  Tobacco Use   Smoking status: Former    Packs/day: 0.75    Years: 6.00    Total pack years: 4.50    Types: Cigarettes    Quit date: 01/13/1972    Years since quitting: 50.8   Smokeless tobacco: Never  Vaping Use   Vaping Use: Never used  Substance and Sexual Activity   Alcohol use: Yes    Alcohol/week: 6.0 standard drinks of alcohol    Types: 6 Shots of liquor per week    Comment: r; usually mixed"   Drug use: No   Sexual activity: Not Currently  Other Topics Concern   Not on file  Social History Narrative   Not on file   Social Determinants of Health   Financial Resource Strain: Not on file  Food Insecurity: Not on file  Transportation Needs: Not on file  Physical Activity: Not on file  Stress: Not on file  Social Connections: Not on file   Family History  Problem Relation Age of Onset   Cancer Mother    Lung disease Father    Breast cancer Sister     ROS: Currently denies lightheadedness, dizziness, Fever, chills, CP, SOB.   No personal history of DVT, PE, MI, or CVA. No loose teeth or dentures All other systems have been reviewed and were otherwise currently negative with the exception of those mentioned in the HPI and as above.  Objective: Vitals: Height: 5?8?Marland Kitchen  Weight: 181 pounds.  Blood pressure: 134/86.  O2 SAT: 96% on room air.  Temperature: 98.1.  Pulse: 83.   Physical  Exam: General: Alert, NAD.  Antalgic Gait  HEENT: EOMI, Good Neck Extension  Pulm: No increased work of breathing.  Clear B/L A/P w/o crackle or wheeze.  CV: RRR, No m/g/r appreciated GI: soft, NT, ND Neuro: Neuro without gross focal deficit.  Sensation intact distally Skin: No lesions in the area of chief complaint MSK/Surgical Site: right knee w/o redness or effusion.  No medial or lateral JLT. ROM 0-120.  5/5 strength in extension and flexion.  +EHL/FHL.  NVI.  Stable varus and valgus stress.    Imaging Review Plain radiographs demonstrate severe degenerative joint disease of the right knee.   The overall alignment is{Neutral/varus:[id number]}. The bone quality appears to be adequate for age and reported activity level.  Preoperative templating of the joint replacement has been completed, documented, and submitted to the Operating Room personnel in order to optimize intra-operative equipment management.  Assessment: right knee djd    Plan: Plan for Procedure(s): TOTAL KNEE ARTHROPLASTY  The patient history, physical exam, clinical judgement of the provider and imaging are consistent with end stage degenerative joint disease and total joint arthroplasty is deemed medically necessary. The treatment options including medical management, injection therapy, and arthroplasty were discussed at length. The risks and benefits of Procedure(s): TOTAL KNEE ARTHROPLASTY were presented and reviewed.  The risks of nonoperative treatment, versus surgical intervention including but not limited to continued pain, aseptic loosening, stiffness, dislocation/subluxation, infection, bleeding, nerve injury, blood clots, cardiopulmonary complications, morbidity, mortality, among others were discussed. The patient verbalizes understanding and wishes to proceed with the plan.  Patient is being admitted for inpatient treatment for surgery, pain control, PT, prophylactic antibiotics, VTE prophylaxis, progressive  ambulation, ADL's and discharge planning.   Dental prophylaxis discussed and recommended for 2 years postoperatively.  The patient does  meet the criteria for TXA which will be used perioperatively.   Xarelto  will be used postoperatively for DVT prophylaxis in addition to SCDs, and early ambulation. The patient is planning to be discharged home with HHPT in care of wife    Jola Baptist 11/17/2022 9:54 AM

## 2022-11-18 NOTE — Anesthesia Preprocedure Evaluation (Signed)
Anesthesia Evaluation  Patient identified by MRN, date of birth, ID band Patient awake    Reviewed: Allergy & Precautions, NPO status , Patient's Chart, lab work & pertinent test results  Airway Mallampati: III  TM Distance: >3 FB Neck ROM: Full    Dental  (+) Teeth Intact, Dental Advisory Given, Caps   Pulmonary sleep apnea , former smoker   Pulmonary exam normal breath sounds clear to auscultation       Cardiovascular hypertension, Pt. on medications Normal cardiovascular exam+ dysrhythmias (LAFB)  Rhythm:Regular Rate:Normal     Neuro/Psych negative neurological ROS     GI/Hepatic negative GI ROS, Neg liver ROS,,,  Endo/Other  diabetes, Type 2, Oral Hypoglycemic Agents    Renal/GU negative Renal ROS     Musculoskeletal  (+) Arthritis ,    Abdominal   Peds  Hematology negative hematology ROS (+) Plt 210k   Anesthesia Other Findings   Reproductive/Obstetrics                             Anesthesia Physical Anesthesia Plan  ASA: 2  Anesthesia Plan: Spinal   Post-op Pain Management: Regional block* and Tylenol PO (pre-op)*   Induction: Intravenous  PONV Risk Score and Plan: 1 and TIVA, Dexamethasone and Ondansetron  Airway Management Planned: Natural Airway and Simple Face Mask  Additional Equipment:   Intra-op Plan:   Post-operative Plan:   Informed Consent: I have reviewed the patients History and Physical, chart, labs and discussed the procedure including the risks, benefits and alternatives for the proposed anesthesia with the patient or authorized representative who has indicated his/her understanding and acceptance.     Dental advisory given  Plan Discussed with: CRNA  Anesthesia Plan Comments:        Anesthesia Quick Evaluation

## 2022-11-19 ENCOUNTER — Ambulatory Visit (HOSPITAL_COMMUNITY): Payer: Medicare Other | Admitting: Physician Assistant

## 2022-11-19 ENCOUNTER — Observation Stay (HOSPITAL_COMMUNITY)
Admission: RE | Admit: 2022-11-19 | Discharge: 2022-11-20 | Disposition: A | Discharge status: Home Health | Payer: Medicare Other | Attending: Orthopedic Surgery | Admitting: Orthopedic Surgery

## 2022-11-19 ENCOUNTER — Ambulatory Visit (HOSPITAL_BASED_OUTPATIENT_CLINIC_OR_DEPARTMENT_OTHER): Payer: Medicare Other | Admitting: Anesthesiology

## 2022-11-19 ENCOUNTER — Observation Stay (HOSPITAL_COMMUNITY): Payer: Medicare Other

## 2022-11-19 ENCOUNTER — Other Ambulatory Visit: Payer: Self-pay

## 2022-11-19 ENCOUNTER — Encounter (HOSPITAL_COMMUNITY)
Admission: RE | Disposition: A | Discharge status: Home Health | Payer: Self-pay | Source: Home / Self Care | Attending: Orthopedic Surgery

## 2022-11-19 ENCOUNTER — Encounter (HOSPITAL_COMMUNITY): Payer: Self-pay | Admitting: Orthopedic Surgery

## 2022-11-19 DIAGNOSIS — I10 Essential (primary) hypertension: Secondary | ICD-10-CM | POA: Insufficient documentation

## 2022-11-19 DIAGNOSIS — M67261 Synovial hypertrophy, not elsewhere classified, right lower leg: Secondary | ICD-10-CM | POA: Diagnosis not present

## 2022-11-19 DIAGNOSIS — M1711 Unilateral primary osteoarthritis, right knee: Secondary | ICD-10-CM | POA: Diagnosis not present

## 2022-11-19 DIAGNOSIS — G8918 Other acute postprocedural pain: Secondary | ICD-10-CM | POA: Diagnosis not present

## 2022-11-19 DIAGNOSIS — Z471 Aftercare following joint replacement surgery: Secondary | ICD-10-CM | POA: Diagnosis not present

## 2022-11-19 DIAGNOSIS — Z87891 Personal history of nicotine dependence: Secondary | ICD-10-CM | POA: Diagnosis not present

## 2022-11-19 DIAGNOSIS — Z96651 Presence of right artificial knee joint: Secondary | ICD-10-CM

## 2022-11-19 DIAGNOSIS — Z96652 Presence of left artificial knee joint: Secondary | ICD-10-CM | POA: Insufficient documentation

## 2022-11-19 DIAGNOSIS — Z85828 Personal history of other malignant neoplasm of skin: Secondary | ICD-10-CM | POA: Diagnosis not present

## 2022-11-19 DIAGNOSIS — Z79899 Other long term (current) drug therapy: Secondary | ICD-10-CM | POA: Insufficient documentation

## 2022-11-19 DIAGNOSIS — M65861 Other synovitis and tenosynovitis, right lower leg: Secondary | ICD-10-CM | POA: Diagnosis not present

## 2022-11-19 HISTORY — PX: TOTAL KNEE ARTHROPLASTY: SHX125

## 2022-11-19 SURGERY — ARTHROPLASTY, KNEE, TOTAL
Anesthesia: Spinal | Site: Knee | Laterality: Right

## 2022-11-19 MED ORDER — LACTATED RINGERS IV SOLN: INTRAVENOUS | Status: DC

## 2022-11-19 MED ORDER — BUPIVACAINE HCL (PF) 0.25 % IJ SOLN
INTRAMUSCULAR | Status: AC
  Filled 2022-11-19: qty 30

## 2022-11-19 MED ORDER — CEFAZOLIN SODIUM-DEXTROSE 2-4 GM/100ML-% IV SOLN
2.0000 g | INTRAVENOUS | Status: AC
  Administered 2022-11-19: 2 g via INTRAVENOUS
  Filled 2022-11-19: qty 100

## 2022-11-19 MED ORDER — BUPIVACAINE IN DEXTROSE 0.75-8.25 % IT SOLN
INTRATHECAL | Status: DC | PRN
  Administered 2022-11-19: 1.6 mL via INTRATHECAL

## 2022-11-19 MED ORDER — PHENOL 1.4 % MT LIQD: 1.0000 | OROMUCOSAL | Status: DC | PRN

## 2022-11-19 MED ORDER — SODIUM CHLORIDE 0.9 % IR SOLN
Status: DC | PRN
  Administered 2022-11-19: 1000 mL

## 2022-11-19 MED ORDER — CHLORHEXIDINE GLUCONATE 0.12 % MT SOLN
15.0000 mL | Freq: Once | OROMUCOSAL | Status: AC
  Administered 2022-11-19: 15 mL via OROMUCOSAL

## 2022-11-19 MED ORDER — DEXAMETHASONE SODIUM PHOSPHATE 10 MG/ML IJ SOLN
INTRAMUSCULAR | Status: DC | PRN
  Administered 2022-11-19: 10 mg

## 2022-11-19 MED ORDER — DEXAMETHASONE SODIUM PHOSPHATE 10 MG/ML IJ SOLN
INTRAMUSCULAR | Status: DC | PRN
  Administered 2022-11-19: 10 mg via INTRAVENOUS

## 2022-11-19 MED ORDER — POVIDONE-IODINE 10 % EX SWAB: 2.0000 | Freq: Once | CUTANEOUS | Status: DC

## 2022-11-19 MED ORDER — POTASSIUM CHLORIDE IN NACL 20-0.9 MEQ/L-% IV SOLN
INTRAVENOUS | Status: DC
  Filled 2022-11-19 (×2): qty 1000

## 2022-11-19 MED ORDER — LATANOPROST 0.005 % OP SOLN
1.0000 [drp] | Freq: Every day | OPHTHALMIC | Status: DC
  Administered 2022-11-19: 1 [drp] via OPHTHALMIC
  Filled 2022-11-19: qty 2.5

## 2022-11-19 MED ORDER — DEXAMETHASONE SODIUM PHOSPHATE 10 MG/ML IJ SOLN
10.0000 mg | Freq: Once | INTRAMUSCULAR | Status: AC
  Administered 2022-11-20: 10 mg via INTRAVENOUS
  Filled 2022-11-19: qty 1

## 2022-11-19 MED ORDER — PHENYLEPHRINE HCL (PRESSORS) 10 MG/ML IV SOLN
INTRAVENOUS | Status: AC
  Filled 2022-11-19: qty 1

## 2022-11-19 MED ORDER — DOCUSATE SODIUM 100 MG PO CAPS
100.0000 mg | ORAL_CAPSULE | Freq: Two times a day (BID) | ORAL | Status: DC
  Administered 2022-11-19 – 2022-11-20 (×2): 100 mg via ORAL
  Filled 2022-11-19 (×2): qty 1

## 2022-11-19 MED ORDER — MIDAZOLAM HCL 5 MG/5ML IJ SOLN
INTRAMUSCULAR | Status: DC | PRN
  Administered 2022-11-19 (×2): 1 mg via INTRAVENOUS

## 2022-11-19 MED ORDER — ORAL CARE MOUTH RINSE: 15.0000 mL | Freq: Once | OROMUCOSAL | Status: AC

## 2022-11-19 MED ORDER — LIDOCAINE HCL (PF) 2 % IJ SOLN
INTRAMUSCULAR | Status: AC
  Filled 2022-11-19: qty 5

## 2022-11-19 MED ORDER — GEMFIBROZIL 600 MG PO TABS
600.0000 mg | ORAL_TABLET | Freq: Two times a day (BID) | ORAL | Status: DC
  Administered 2022-11-20: 600 mg via ORAL
  Filled 2022-11-19: qty 1

## 2022-11-19 MED ORDER — BUPIVACAINE HCL 0.25 % IJ SOLN
INTRAMUSCULAR | Status: DC | PRN
  Administered 2022-11-19: 30 mL

## 2022-11-19 MED ORDER — FENTANYL CITRATE (PF) 100 MCG/2ML IJ SOLN
INTRAMUSCULAR | Status: AC
  Filled 2022-11-19: qty 2

## 2022-11-19 MED ORDER — HYDROMORPHONE HCL 1 MG/ML IJ SOLN: 0.5000 mg | INTRAMUSCULAR | Status: DC | PRN

## 2022-11-19 MED ORDER — CEFAZOLIN SODIUM-DEXTROSE 2-4 GM/100ML-% IV SOLN
2.0000 g | Freq: Four times a day (QID) | INTRAVENOUS | Status: AC
  Administered 2022-11-19 (×2): 2 g via INTRAVENOUS
  Filled 2022-11-19 (×2): qty 100

## 2022-11-19 MED ORDER — TRANEXAMIC ACID-NACL 1000-0.7 MG/100ML-% IV SOLN
1000.0000 mg | Freq: Once | INTRAVENOUS | Status: AC
  Administered 2022-11-19: 1000 mg via INTRAVENOUS
  Filled 2022-11-19: qty 100

## 2022-11-19 MED ORDER — ACETAMINOPHEN 500 MG PO TABS
1000.0000 mg | ORAL_TABLET | Freq: Four times a day (QID) | ORAL | Status: AC
  Administered 2022-11-19 – 2022-11-20 (×4): 1000 mg via ORAL
  Filled 2022-11-19 (×4): qty 2

## 2022-11-19 MED ORDER — PROPOFOL 10 MG/ML IV BOLUS
INTRAVENOUS | Status: AC
  Filled 2022-11-19: qty 20

## 2022-11-19 MED ORDER — ROPIVACAINE HCL 5 MG/ML IJ SOLN
INTRAMUSCULAR | Status: DC | PRN
  Administered 2022-11-19: 20 mL via PERINEURAL

## 2022-11-19 MED ORDER — FENTANYL CITRATE PF 50 MCG/ML IJ SOSY: 25.0000 ug | PREFILLED_SYRINGE | INTRAMUSCULAR | Status: DC | PRN

## 2022-11-19 MED ORDER — POVIDONE-IODINE 7.5 % EX SOLN: Freq: Once | CUTANEOUS | Status: DC

## 2022-11-19 MED ORDER — OXYCODONE HCL 5 MG PO TABS: 10.0000 mg | ORAL_TABLET | ORAL | Status: DC | PRN

## 2022-11-19 MED ORDER — BUPIVACAINE HCL 0.25 % IJ SOLN
INTRAMUSCULAR | Status: AC
  Filled 2022-11-19: qty 1

## 2022-11-19 MED ORDER — DIPHENHYDRAMINE HCL 12.5 MG/5ML PO ELIX: 12.5000 mg | ORAL_SOLUTION | ORAL | Status: DC | PRN

## 2022-11-19 MED ORDER — ALUM & MAG HYDROXIDE-SIMETH 200-200-20 MG/5ML PO SUSP: 30.0000 mL | ORAL | Status: DC | PRN

## 2022-11-19 MED ORDER — METHOCARBAMOL 500 MG IVPB - SIMPLE MED
500.0000 mg | Freq: Four times a day (QID) | INTRAVENOUS | Status: DC | PRN
  Administered 2022-11-19: 500 mg via INTRAVENOUS

## 2022-11-19 MED ORDER — FENTANYL CITRATE (PF) 100 MCG/2ML IJ SOLN
INTRAMUSCULAR | Status: DC | PRN
  Administered 2022-11-19 (×2): 50 ug via INTRAVENOUS

## 2022-11-19 MED ORDER — ONDANSETRON HCL 4 MG/2ML IJ SOLN: 4.0000 mg | Freq: Once | INTRAMUSCULAR | Status: DC | PRN

## 2022-11-19 MED ORDER — WATER FOR IRRIGATION, STERILE IR SOLN
Status: DC | PRN
  Administered 2022-11-19: 2000 mL

## 2022-11-19 MED ORDER — METHOCARBAMOL 500 MG PO TABS
500.0000 mg | ORAL_TABLET | Freq: Four times a day (QID) | ORAL | Status: DC | PRN
  Administered 2022-11-20 (×2): 500 mg via ORAL
  Filled 2022-11-19 (×2): qty 1

## 2022-11-19 MED ORDER — KETOROLAC TROMETHAMINE 30 MG/ML IJ SOLN
INTRAMUSCULAR | Status: AC
  Filled 2022-11-19: qty 1

## 2022-11-19 MED ORDER — TRANEXAMIC ACID-NACL 1000-0.7 MG/100ML-% IV SOLN
1000.0000 mg | INTRAVENOUS | Status: AC
  Administered 2022-11-19: 1000 mg via INTRAVENOUS
  Filled 2022-11-19: qty 100

## 2022-11-19 MED ORDER — ZOLPIDEM TARTRATE 5 MG PO TABS
5.0000 mg | ORAL_TABLET | Freq: Every evening | ORAL | Status: DC | PRN
  Administered 2022-11-20: 5 mg via ORAL
  Filled 2022-11-19: qty 1

## 2022-11-19 MED ORDER — KETOROLAC TROMETHAMINE 30 MG/ML IJ SOLN
INTRAMUSCULAR | Status: DC | PRN
  Administered 2022-11-19: 30 mg

## 2022-11-19 MED ORDER — SODIUM CHLORIDE 0.9 % IV SOLN: INTRAVENOUS | Status: DC | PRN

## 2022-11-19 MED ORDER — ACETAMINOPHEN 500 MG PO TABS
1000.0000 mg | ORAL_TABLET | Freq: Once | ORAL | Status: AC
  Administered 2022-11-19: 1000 mg via ORAL
  Filled 2022-11-19: qty 2

## 2022-11-19 MED ORDER — PROPOFOL 1000 MG/100ML IV EMUL
INTRAVENOUS | Status: AC
  Filled 2022-11-19: qty 100

## 2022-11-19 MED ORDER — TERBINAFINE HCL 1 % EX CREA
TOPICAL_CREAM | Freq: Every day | CUTANEOUS | Status: DC
  Filled 2022-11-19: qty 12

## 2022-11-19 MED ORDER — POLYETHYLENE GLYCOL 3350 17 G PO PACK: 17.0000 g | PACK | Freq: Every day | ORAL | Status: DC | PRN

## 2022-11-19 MED ORDER — ONDANSETRON HCL 4 MG PO TABS: 4.0000 mg | ORAL_TABLET | Freq: Four times a day (QID) | ORAL | Status: DC | PRN

## 2022-11-19 MED ORDER — PROPOFOL 500 MG/50ML IV EMUL
INTRAVENOUS | Status: DC | PRN
  Administered 2022-11-19: 75 ug/kg/min via INTRAVENOUS

## 2022-11-19 MED ORDER — OXYCODONE HCL 5 MG PO TABS
5.0000 mg | ORAL_TABLET | ORAL | Status: DC | PRN
  Administered 2022-11-20 (×2): 10 mg via ORAL
  Filled 2022-11-19 (×2): qty 2

## 2022-11-19 MED ORDER — PHENYLEPHRINE HCL-NACL 20-0.9 MG/250ML-% IV SOLN
INTRAVENOUS | Status: DC | PRN
  Administered 2022-11-19: 50 ug/min via INTRAVENOUS

## 2022-11-19 MED ORDER — ONDANSETRON HCL 4 MG/2ML IJ SOLN: 4.0000 mg | Freq: Four times a day (QID) | INTRAMUSCULAR | Status: DC | PRN

## 2022-11-19 MED ORDER — MIDAZOLAM HCL 2 MG/2ML IJ SOLN
INTRAMUSCULAR | Status: AC
  Filled 2022-11-19: qty 2

## 2022-11-19 MED ORDER — PROPOFOL 10 MG/ML IV BOLUS
INTRAVENOUS | Status: DC | PRN
  Administered 2022-11-19 (×2): 20 mg via INTRAVENOUS

## 2022-11-19 MED ORDER — METOCLOPRAMIDE HCL 5 MG/ML IJ SOLN: 5.0000 mg | Freq: Three times a day (TID) | INTRAMUSCULAR | Status: DC | PRN

## 2022-11-19 MED ORDER — NIFEDIPINE ER OSMOTIC RELEASE 60 MG PO TB24
60.0000 mg | ORAL_TABLET | Freq: Every day | ORAL | Status: DC
  Administered 2022-11-20: 60 mg via ORAL
  Filled 2022-11-19: qty 1

## 2022-11-19 MED ORDER — RIVAROXABAN 10 MG PO TABS
10.0000 mg | ORAL_TABLET | Freq: Every day | ORAL | Status: DC
  Administered 2022-11-20: 10 mg via ORAL
  Filled 2022-11-19: qty 1

## 2022-11-19 MED ORDER — METOCLOPRAMIDE HCL 5 MG PO TABS: 5.0000 mg | ORAL_TABLET | Freq: Three times a day (TID) | ORAL | Status: DC | PRN

## 2022-11-19 MED ORDER — LIDOCAINE 2% (20 MG/ML) 5 ML SYRINGE
INTRAMUSCULAR | Status: DC | PRN
  Administered 2022-11-19: 100 mg via INTRAVENOUS

## 2022-11-19 MED ORDER — METHOCARBAMOL 500 MG IVPB - SIMPLE MED
INTRAVENOUS | Status: AC
  Filled 2022-11-19: qty 55

## 2022-11-19 MED ORDER — ONDANSETRON HCL 4 MG/2ML IJ SOLN
INTRAMUSCULAR | Status: AC
  Filled 2022-11-19: qty 2

## 2022-11-19 MED ORDER — LOSARTAN POTASSIUM 50 MG PO TABS
50.0000 mg | ORAL_TABLET | Freq: Every day | ORAL | Status: DC
  Administered 2022-11-19 – 2022-11-20 (×2): 50 mg via ORAL
  Filled 2022-11-19 (×2): qty 1

## 2022-11-19 MED ORDER — MAGNESIUM CITRATE PO SOLN: 1.0000 | Freq: Once | ORAL | Status: DC | PRN

## 2022-11-19 MED ORDER — DEXAMETHASONE SODIUM PHOSPHATE 10 MG/ML IJ SOLN
INTRAMUSCULAR | Status: AC
  Filled 2022-11-19: qty 1

## 2022-11-19 MED ORDER — MENTHOL 3 MG MT LOZG: 1.0000 | LOZENGE | OROMUCOSAL | Status: DC | PRN

## 2022-11-19 MED ORDER — ONDANSETRON HCL 4 MG/2ML IJ SOLN
INTRAMUSCULAR | Status: DC | PRN
  Administered 2022-11-19: 4 mg via INTRAVENOUS

## 2022-11-19 MED ORDER — ACETAMINOPHEN 325 MG PO TABS: 325.0000 mg | ORAL_TABLET | Freq: Four times a day (QID) | ORAL | Status: DC | PRN

## 2022-11-19 MED ORDER — ATORVASTATIN CALCIUM 20 MG PO TABS
20.0000 mg | ORAL_TABLET | Freq: Every day | ORAL | Status: DC
  Administered 2022-11-20: 20 mg via ORAL
  Filled 2022-11-19: qty 1

## 2022-11-19 MED ORDER — BISACODYL 10 MG RE SUPP: 10.0000 mg | Freq: Every day | RECTAL | Status: DC | PRN

## 2022-11-19 SURGICAL SUPPLY — 48 items
ATTUNE MED DOME PAT 38 KNEE (Knees) IMPLANT
ATTUNE PS FEM RT SZ 6 CEM KNEE (Femur) IMPLANT
BASEPLATE TIB CMT FB PCKT SZ5 (Knees) IMPLANT
BLADE SAG 18X100X1.27 (BLADE) ×1 IMPLANT
BLADE SAW SGTL 11.0X1.19X90.0M (BLADE) IMPLANT
BLADE SAW SGTL 13X75X1.27 (BLADE) ×1 IMPLANT
BLADE SURG 15 STRL LF DISP TIS (BLADE) ×1 IMPLANT
BLADE SURG 15 STRL SS (BLADE) ×1
BNDG ELASTIC 6X10 VLCR STRL LF (GAUZE/BANDAGES/DRESSINGS) ×1 IMPLANT
BOWL SMART MIX CTS (DISPOSABLE) ×1 IMPLANT
CEMENT HV SMART SET (Cement) ×2 IMPLANT
CLSR STERI-STRIP ANTIMIC 1/2X4 (GAUZE/BANDAGES/DRESSINGS) ×2 IMPLANT
COVER SURGICAL LIGHT HANDLE (MISCELLANEOUS) ×1 IMPLANT
CUFF TOURN SGL QUICK 34 (TOURNIQUET CUFF) ×1
CUFF TRNQT CYL 34X4.125X (TOURNIQUET CUFF) ×1 IMPLANT
DRAPE SHEET LG 3/4 BI-LAMINATE (DRAPES) ×1 IMPLANT
DRAPE U-SHAPE 47X51 STRL (DRAPES) ×1 IMPLANT
DRSG MEPILEX POST OP 4X12 (GAUZE/BANDAGES/DRESSINGS) ×1 IMPLANT
DURAPREP 26ML APPLICATOR (WOUND CARE) ×2 IMPLANT
ELECT REM PT RETURN 15FT ADLT (MISCELLANEOUS) ×1 IMPLANT
GAUZE PAD ABD 8X10 STRL (GAUZE/BANDAGES/DRESSINGS) ×2 IMPLANT
GLOVE BIO SURGEON STRL SZ 6.5 (GLOVE) ×1 IMPLANT
GLOVE BIO SURGEON STRL SZ7.5 (GLOVE) ×1 IMPLANT
GLOVE BIOGEL PI IND STRL 7.0 (GLOVE) ×1 IMPLANT
GLOVE BIOGEL PI IND STRL 8 (GLOVE) ×1 IMPLANT
GOWN STRL SURGICAL XL XLNG (GOWN DISPOSABLE) ×2 IMPLANT
HANDPIECE INTERPULSE COAX TIP (DISPOSABLE) ×1
HOLDER FOLEY CATH W/STRAP (MISCELLANEOUS) IMPLANT
IMMOBILIZER KNEE 20 (SOFTGOODS) ×1
IMMOBILIZER KNEE 20 THIGH 36 (SOFTGOODS) ×1 IMPLANT
INSERT TIB ATTUNE FB SZ6X6 (Insert) IMPLANT
KIT TURNOVER KIT A (KITS) IMPLANT
MANIFOLD NEPTUNE II (INSTRUMENTS) ×1 IMPLANT
NS IRRIG 1000ML POUR BTL (IV SOLUTION) ×1 IMPLANT
PACK TOTAL KNEE CUSTOM (KITS) ×1 IMPLANT
PIN STEINMAN FIXATION KNEE (PIN) IMPLANT
PROTECTOR NERVE ULNAR (MISCELLANEOUS) ×1 IMPLANT
SET HNDPC FAN SPRY TIP SCT (DISPOSABLE) ×1 IMPLANT
SET PAD KNEE POSITIONER (MISCELLANEOUS) ×1 IMPLANT
SUT VIC AB 1 CT1 36 (SUTURE) ×2 IMPLANT
SUT VIC AB 2-0 CT1 27 (SUTURE) ×1
SUT VIC AB 2-0 CT1 TAPERPNT 27 (SUTURE) ×1 IMPLANT
SUT VIC AB 3-0 SH 27 (SUTURE)
SUT VIC AB 3-0 SH 27X BRD (SUTURE) IMPLANT
TRAY FOLEY MTR SLVR 16FR STAT (SET/KITS/TRAYS/PACK) ×1 IMPLANT
TUBE SUCTION HIGH CAP CLEAR NV (SUCTIONS) ×1 IMPLANT
WATER STERILE IRR 1000ML POUR (IV SOLUTION) ×2 IMPLANT
WRAP KNEE MAXI GEL POST OP (GAUZE/BANDAGES/DRESSINGS) ×1 IMPLANT

## 2022-11-19 NOTE — Interval H&P Note (Signed)
History and Physical Interval Note:  11/19/2022 7:14 AM  [name].  has presented today for surgery, with the diagnosis of right knee djd.  The various methods of treatment have been discussed with the patient and family. After consideration of risks, benefits and other options for treatment, the patient has consented to  Procedure(s): TOTAL KNEE ARTHROPLASTY (Right) as a surgical intervention.  The patient's history has been reviewed, patient examined, no change in status, stable for surgery.  I have reviewed the patient's chart and labs.  Questions were answered to the patient's satisfaction.     Steven Austin

## 2022-11-19 NOTE — Evaluation (Signed)
Physical Therapy Evaluation Patient Details Name: Steven Austin. MRN: [id number] DOB: December 26, 1945 Today's Date: 11/19/2022  History of Present Illness  Pt is a 77yo male presenting s/p R-TKA on 11/19/22. PMH: HLD, HTN, OSA, L-TKA 2015.  Clinical Impression  Vin Yonke. is a 77 y.o. male POD 0 s/p R-TKA. Patient reports modified independence with mobility at baseline. Patient is now limited by functional impairments (see PT problem list below) and requires supervision for bed mobility and min guard for transfers. Patient was able to ambulate 25 feet with RW and min guard level of assist. Patient instructed in exercise to facilitate ROM and circulation to manage edema. Provided incentive spirometer and with Vcs pt able to achieve 2268m. Patient will benefit from continued skilled PT interventions to address impairments and progress towards PLOF. Acute PT will follow to progress mobility and stair training in preparation for safe discharge home.       Recommendations for follow up therapy are one component of a multi-disciplinary discharge planning process, led by the attending physician.  Recommendations may be updated based on patient status, additional functional criteria and insurance authorization.  Follow Up Recommendations Follow physician's recommendations for discharge plan and follow up therapies      Assistance Recommended at Discharge Frequent or constant Supervision/Assistance  Patient can return home with the following  A little help with walking and/or transfers;A little help with bathing/dressing/bathroom;Assistance with cooking/housework;Help with stairs or ramp for entrance;Assist for transportation    Equipment Recommendations None recommended by PT  Recommendations for Other Services       Functional Status Assessment Patient has had a recent decline in their functional status and demonstrates the ability to make significant improvements in function in a  reasonable and predictable amount of time.     Precautions / Restrictions Precautions Precautions: Fall;Knee Precaution Booklet Issued: No Precaution Comments: no pillow under the knee Restrictions Weight Bearing Restrictions: No Other Position/Activity Restrictions: wbat      Mobility  Bed Mobility Overal bed mobility: Needs Assistance Bed Mobility: Supine to Sit     Supine to sit: Supervision, HOB elevated     General bed mobility comments: For safety only    Transfers Overall transfer level: Needs assistance Equipment used: Rolling walker (2 wheels) Transfers: Sit to/from Stand Sit to Stand: Min guard           General transfer comment: For safety only, VCs for sequencing, hand placement, and powering up through LLE. Pt tolerated lateral weight shifts and 1-2 reps standing marches in RW to demonstrate WB status.    Ambulation/Gait Ambulation/Gait assistance: Min guard Gait Distance (Feet): 25 Feet Assistive device: Rolling walker (2 wheels) Gait Pattern/deviations: Step-to pattern Gait velocity: decreased     General Gait Details: Pt ambulated with RW and min guard with no overt LOB noted or physical assist required, VCs for proximity to device.  Stairs            Wheelchair Mobility    Modified Rankin (Stroke Patients Only)       Balance                                             Pertinent Vitals/Pain Pain Assessment Pain Assessment: 0-10 Pain Score: 1  Pain Location: right knee Pain Descriptors / Indicators: Operative site guarding Pain Intervention(s): Limited activity within patient's tolerance, Monitored during session, Repositioned,  Ice applied    Home Living Family/patient expects to be discharged to:: Private residence Living Arrangements: Spouse/significant other Available Help at Discharge: Family;Available 24 hours/day Type of Home: House Home Access: Stairs to enter Entrance Stairs-Rails:  Psychiatric nurse of Steps: 6   Home Layout: Multi-level;Able to live on main level with bedroom/bathroom Home Equipment: BSC/3in1;Rolling Walker (2 wheels);Cane - single point Additional Comments: Pt will be hiring caregivers part-time Wed, 8 hours thurs, 8 hours friday    Prior Function Prior Level of Function : Independent/Modified Independent             Mobility Comments: SPC ADLs Comments: IND     Hand Dominance        Extremity/Trunk Assessment   Upper Extremity Assessment Upper Extremity Assessment: Overall WFL for tasks assessed    Lower Extremity Assessment Lower Extremity Assessment: RLE deficits/detail;LLE deficits/detail RLE Deficits / Details: MMT ank DF/PF 3+/5 no extensor lag noted RLE Sensation: WNL LLE Deficits / Details: MMT ank DF/PF 5/5 LLE Sensation: WNL    Cervical / Trunk Assessment Cervical / Trunk Assessment: Kyphotic  Communication   Communication: HOH  Cognition Arousal/Alertness: Awake/alert Behavior During Therapy: WFL for tasks assessed/performed Overall Cognitive Status: Within Functional Limits for tasks assessed                                          General Comments General comments (skin integrity, edema, etc.): Wife present    Exercises Total Joint Exercises Ankle Circles/Pumps: AROM, Both, 20 reps   Assessment/Plan    PT Assessment Patient needs continued PT services  PT Problem List Decreased strength;Decreased range of motion;Decreased activity tolerance;Decreased balance;Decreased mobility;Pain       PT Treatment Interventions DME instruction;Gait training;Stair training;Functional mobility training;Therapeutic activities;Therapeutic exercise;Balance training;Patient/family education;Neuromuscular re-education    PT Goals (Current goals can be found in the Care Plan section)  Acute Rehab PT Goals Patient Stated Goal: Walk further and faster, vacuuming more PT Goal Formulation:  With patient Time For Goal Achievement: 11/26/22 Potential to Achieve Goals: Good    Frequency 7X/week     Co-evaluation               AM-PAC PT "6 Clicks" Mobility  Outcome Measure Help needed turning from your back to your side while in a flat bed without using bedrails?: None Help needed moving from lying on your back to sitting on the side of a flat bed without using bedrails?: A Little Help needed moving to and from a bed to a chair (including a wheelchair)?: A Little Help needed standing up from a chair using your arms (e.g., wheelchair or bedside chair)?: A Little Help needed to walk in hospital room?: A Little Help needed climbing 3-5 steps with a railing? : A Little 6 Click Score: 19    End of Session Equipment Utilized During Treatment: Gait belt Activity Tolerance: Patient tolerated treatment well;No increased pain Patient left: in chair;with call bell/phone within reach;with chair alarm set Nurse Communication: Mobility status PT Visit Diagnosis: Difficulty in walking, not elsewhere classified (R26.2);Pain Pain - Right/Left: Right Pain - part of body: Knee    Time: 8588-5027 PT Time Calculation (min) (ACUTE ONLY): 18 min   Charges:   PT Evaluation $PT Eval Low Complexity: 1 Low          Coolidge Breeze, PT, DPT [facility] Office: [phone] Weekend pager: [phone]  Coolidge Breeze 11/19/2022, 3:58 PM

## 2022-11-19 NOTE — Anesthesia Procedure Notes (Signed)
Date/Time: 11/19/2022 7:26 AM  Performed by: Sharlette Dense, CRNAOxygen Delivery Method: Simple face mask

## 2022-11-19 NOTE — Anesthesia Procedure Notes (Signed)
Spinal  Patient location during procedure: OR Start time: 11/19/2022 7:27 AM End time: 11/19/2022 7:30 AM Reason for block: surgical anesthesia Staffing Performed: anesthesiologist  Anesthesiologist: Santa Lighter, MD Performed by: Santa Lighter, MD Authorized by: Santa Lighter, MD   Preanesthetic Checklist Completed: patient identified, IV checked, risks and benefits discussed, surgical consent, monitors and equipment checked, pre-op evaluation and timeout performed Spinal Block Patient position: sitting Prep: DuraPrep and site prepped and draped Patient monitoring: continuous pulse ox and blood pressure Approach: midline Location: L3-4 Injection technique: single-shot Needle Needle type: Pencan  Needle gauge: 24 G Assessment Events: CSF return Additional Notes Functioning IV was confirmed and monitors were applied. Sterile prep and drape, including hand hygiene, mask and sterile gloves were used. The patient was positioned and the spine was prepped. The skin was anesthetized with lidocaine.  Free flow of clear CSF was obtained prior to injecting local anesthetic into the CSF.  The spinal needle aspirated freely following injection.  The needle was carefully withdrawn.  The patient tolerated the procedure well. Consent was obtained prior to procedure with all questions answered and concerns addressed. Risks including but not limited to bleeding, infection, nerve damage, paralysis, failed block, inadequate analgesia, allergic reaction, high spinal, itching and headache were discussed and the patient wished to proceed.   Hoy Morn, MD

## 2022-11-19 NOTE — Discharge Instructions (Addendum)
INSTRUCTIONS AFTER JOINT REPLACEMENT   Remove items at home which could result in a fall. This includes throw rugs or furniture in walking pathways ICE to the affected joint every three hours while awake for 30 minutes at a time, for at least the first 3-5 days, and then as needed for pain and swelling.  Continue to use ice for pain and swelling. You may notice swelling that will progress down to the foot and ankle.  This is normal after surgery.  Elevate your leg when you are not up walking on it.   Continue to use the breathing machine you got in the hospital (incentive spirometer) which will help keep your temperature down.  It is common for your temperature to cycle up and down following surgery, especially at night when you are not up moving around and exerting yourself.  The breathing machine keeps your lungs expanded and your temperature down.   DIET:  As you were doing prior to hospitalization, we recommend a well-balanced diet.  DRESSING / WOUND CARE / SHOWERING  You may shower 3 days after surgery, but keep the wounds dry during showering.  You may use an occlusive plastic wrap (Press'n Seal for example), NO SOAKING/SUBMERGING IN THE BATHTUB.  If the bandage gets wet, change with a clean dry gauze.  If the incision gets wet, pat the wound dry with a clean towel.  ACTIVITY  Increase activity slowly as tolerated, but follow the weight bearing instructions below.   No driving for 6 weeks or until further direction given by your physician.  You cannot drive while taking narcotics.  No lifting or carrying greater than 10 lbs. until further directed by your surgeon. Avoid periods of inactivity such as sitting longer than an hour when not asleep. This helps prevent blood clots.  You may return to work once you are authorized by your doctor.     WEIGHT BEARING   Weight bearing as tolerated with assist device (walker, cane, etc) as directed, use it as long as suggested by your surgeon or  therapist, typically at least 4-6 weeks.   EXERCISES  Results after joint replacement surgery are often greatly improved when you follow the exercise, range of motion and muscle strengthening exercises prescribed by your doctor. Safety measures are also important to protect the joint from further injury. Any time any of these exercises cause you to have increased pain or swelling, decrease what you are doing until you are comfortable again and then slowly increase them. If you have problems or questions, call your caregiver or physical therapist for advice.   Rehabilitation is important following a joint replacement. After just a few days of immobilization, the muscles of the leg can become weakened and shrink (atrophy).  These exercises are designed to build up the tone and strength of the thigh and leg muscles and to improve motion. Often times heat used for twenty to thirty minutes before working out will loosen up your tissues and help with improving the range of motion but do not use heat for the first two weeks following surgery (sometimes heat can increase post-operative swelling).   These exercises can be done on a training (exercise) mat, on the floor, on a table or on a bed. Use whatever works the best and is most comfortable for you.    Use music or television while you are exercising so that the exercises are a pleasant break in your day. This will make your life better with the exercises acting   as a break in your routine that you can look forward to.   Perform all exercises about fifteen times, three times per day or as directed.  You should exercise both the operative leg and the other leg as well.  Exercises include:   Quad Sets - Tighten up the muscle on the front of the thigh (Quad) and hold for 5-10 seconds.   Straight Leg Raises - With your knee straight (if you were given a brace, keep it on), lift the leg to 60 degrees, hold for 3 seconds, and slowly lower the leg.  Perform this  exercise against resistance later as your leg gets stronger.  Leg Slides: Lying on your back, slowly slide your foot toward your buttocks, bending your knee up off the floor (only go as far as is comfortable). Then slowly slide your foot back down until your leg is flat on the floor again.  Angel Wings: Lying on your back spread your legs to the side as far apart as you can without causing discomfort.  Hamstring Strength:  Lying on your back, push your heel against the floor with your leg straight by tightening up the muscles of your buttocks.  Repeat, but this time bend your knee to a comfortable angle, and push your heel against the floor.  You may put a pillow under the heel to make it more comfortable if necessary.   A rehabilitation program following joint replacement surgery can speed recovery and prevent re-injury in the future due to weakened muscles. Contact your doctor or a physical therapist for more information on knee rehabilitation.    CONSTIPATION  Constipation is defined medically as fewer than three stools per week and severe constipation as less than one stool per week.  Even if you have a regular bowel pattern at home, your normal regimen is likely to be disrupted due to multiple reasons following surgery.  Combination of anesthesia, postoperative narcotics, change in appetite and fluid intake all can affect your bowels.   YOU MUST use at least one of the following options; they are listed in order of increasing strength to get the job done.  They are all available over the counter, and you may need to use some, POSSIBLY even all of these options:    Drink plenty of fluids (prune juice may be helpful) and high fiber foods Colace 100 mg by mouth twice a day  Senokot for constipation as directed and as needed Dulcolax (bisacodyl), take with full glass of water  Miralax (polyethylene glycol) once or twice a day as needed.  If you have tried all these things and are unable to have a  bowel movement in the first 3-4 days after surgery call either your surgeon or your primary doctor.    If you experience loose stools or diarrhea, hold the medications until you stool forms back up.  If your symptoms do not get better within 1 week or if they get worse, check with your doctor.  If you experience "the worst abdominal pain ever" or develop nausea or vomiting, please contact the office immediately for further recommendations for treatment.   ITCHING:  If you experience itching with your medications, try taking only a single pain pill, or even half a pain pill at a time.  You can also use Benadryl over the counter for itching or also to help with sleep.   TED HOSE STOCKINGS:  Use stockings on both legs until for at least 2 weeks or as directed by  physician office. They may be removed at night for sleeping.  MEDICATIONS:  See your medication summary on the "After Visit Summary" that nursing will review with you.  You may have some home medications which will be placed on hold until you complete the course of blood thinner medication.  It is important for you to complete the blood thinner medication as prescribed.  PRECAUTIONS:  If you experience chest pain or shortness of breath - call 911 immediately for transfer to the hospital emergency department.   If you develop a fever greater that 101 F, purulent drainage from wound, increased redness or drainage from wound, foul odor from the wound/dressing, or calf pain - CONTACT YOUR SURGEON.                                                   FOLLOW-UP APPOINTMENTS:  If you do not already have a post-op appointment, please call the office for an appointment to be seen by your surgeon.  Guidelines for how soon to be seen are listed in your "After Visit Summary", but are typically between 1-4 weeks after surgery.  OTHER INSTRUCTIONS:   Knee Replacement:  Do not place pillow under knee, focus on keeping the knee straight while resting.    POST-OPERATIVE OPIOID TAPER INSTRUCTIONS: It is important to wean off of your opioid medication as soon as possible. If you do not need pain medication after your surgery it is ok to stop day one. Opioids include: Codeine, Hydrocodone(Norco, Vicodin), Oxycodone(Percocet, oxycontin) and hydromorphone amongst others.  Long term and even short term use of opiods can cause: Increased pain response Dependence Constipation Depression Respiratory depression And more.  Withdrawal symptoms can include Flu like symptoms Nausea, vomiting And more Techniques to manage these symptoms Hydrate well Eat regular healthy meals Stay active Use relaxation techniques(deep breathing, meditating, yoga) Do Not substitute Alcohol to help with tapering If you have been on opioids for less than two weeks and do not have pain than it is ok to stop all together.  Plan to wean off of opioids This plan should start within one week post op of your joint replacement. Maintain the same interval or time between taking each dose and first decrease the dose.  Cut the total daily intake of opioids by one tablet each day Next start to increase the time between doses. The last dose that should be eliminated is the evening dose.   MAKE SURE YOU:  Understand these instructions.  Get help right away if you are not doing well or get worse.    Thank you for letting us be a part of your medical care team.  It is a privilege we respect greatly.  We hope these instructions will help you stay on track for a fast and full recovery!  _____________________________________________________________  Information on my medicine - XARELTO (Rivaroxaban)  This medication education was reviewed with me or my healthcare representative as part of my discharge preparation.    Why was Xarelto prescribed for you? Xarelto was prescribed for you to reduce the risk of blood clots forming after orthopedic surgery. The medical term for  these abnormal blood clots is venous thromboembolism (VTE).  What do you need to know about xarelto ? Take your Xarelto ONCE DAILY at the same time every day. You may take it either with or without  food.  If you have difficulty swallowing the tablet whole, you may crush it and mix in applesauce just prior to taking your dose.  Take Xarelto exactly as prescribed by your doctor and DO NOT stop taking Xarelto without talking to the doctor who prescribed the medication.  Stopping without other VTE prevention medication to take the place of Xarelto may increase your risk of developing a clot.  After discharge, you should have regular check-up appointments with your healthcare provider that is prescribing your Xarelto.    What do you do if you miss a dose? If you miss a dose, take it as soon as you remember on the same day then continue your regularly scheduled once daily regimen the next day. Do not take two doses of Xarelto on the same day.   Important Safety Information A possible side effect of Xarelto is bleeding. You should call your healthcare provider right away if you experience any of the following: Bleeding from an injury or your nose that does not stop. Unusual colored urine (red or dark brown) or unusual colored stools (red or black). Unusual bruising for unknown reasons. A serious fall or if you hit your head (even if there is no bleeding).  Some medicines may interact with Xarelto and might increase your risk of bleeding while on Xarelto. To help avoid this, consult your healthcare provider or pharmacist prior to using any new prescription or non-prescription medications, including herbals, vitamins, non-steroidal anti-inflammatory drugs (NSAIDs) and supplements.  This website has more information on Xarelto: [email or web address]

## 2022-11-19 NOTE — Anesthesia Procedure Notes (Signed)
Anesthesia Regional Block: Adductor canal block   Pre-Anesthetic Checklist: , timeout performed,  Correct Patient, Correct Site, Correct Laterality,  Correct Procedure, Correct Position, site marked,  Risks and benefits discussed,  Surgical consent,  Pre-op evaluation,  At surgeon's request and post-op pain management  Laterality: Right  Prep: chloraprep       Needles:  Injection technique: Single-shot  Needle Type: Echogenic Needle     Needle Length: 9cm  Needle Gauge: 21     Additional Needles:   Procedures:,,,, ultrasound used (permanent image in chart),,    Narrative:  Start time: 11/19/2022 6:47 AM End time: 11/19/2022 6:55 AM Injection made incrementally with aspirations every 5 mL.  Performed by: Personally  Anesthesiologist: Santa Lighter, MD  Additional Notes: No pain on injection. No increased resistance to injection. Injection made in 5cc increments.  Good needle visualization.  Patient tolerated procedure well.

## 2022-11-19 NOTE — Op Note (Signed)
DATE OF SURGERY:  11/19/2022 TIME: 9:21 AM  PATIENT NAME:  Steven Austin.   AGE: 77 y.o.    PRE-OPERATIVE DIAGNOSIS:  right knee primary localized osteoarthritis  POST-OPERATIVE DIAGNOSIS:  Same  PROCEDURE:  Right Total Knee Arthroplasty  SURGEON:  Johnny Bridge, MD   ASSISTANT:  Merlene Pulling, PA-C, present and scrubbed throughout the case, critical for assistance with exposure, retraction, instrumentation, and closure.   OPERATIVE IMPLANTS: Depuy Attune size 6 Posterior Stabilized Femur, with a size 5 Fixed Bearing Tibia, 6 Polyethylene insert with a 38 medialized oval dome polyethylene patella.  PREOPERATIVE INDICATIONS:  Steven Bonura. is a 77 y.o. year old male with end stage bone on bone degenerative arthritis of the knee who failed conservative treatment, including injections, antiinflammatories, activity modification, and assistive devices, and had significant impairment of their activities of daily living, and elected for Total Knee Arthroplasty.   The risks, benefits, and alternatives were discussed at length including but not limited to the risks of infection, bleeding, nerve injury, stiffness, blood clots, the need for revision surgery, cardiopulmonary complications, among others, and they were willing to proceed.  OPERATIVE FINDINGS AND UNIQUE ASPECTS OF THE CASE: He had substantial eburnation of the medial compartment with a very strange pattern of wear on the lateral compartment I think that it is because he had ACL deficiency, with subluxation of the tibia under the femur.  Bone quality was in fact relatively poor, consistent with what Dr. Noemi Chapel experienced.  There was severe grade 4 loss in the patellofemoral joint as well, the lateral facet of the patella was fairly thin.  ESTIMATED BLOOD LOSS: 100 mL  OPERATIVE DESCRIPTION:  The patient was brought to the operative room and placed in a supine position.  Anesthesia was administered.  IV antibiotics were  given.  The lower extremity was prepped and draped in the usual sterile fashion.  Time out was performed.  The leg was elevated and exsanguinated and the tourniquet was inflated.  Anterior quadriceps tendon splitting approach was performed.  The patella was everted and osteophytes were removed.  The anterior horn of the medial and lateral meniscus was removed.   The patella was then measured, and cut with the saw.  The thickness before the cut was 22 and after the cut was 13.5.  A metal shield was used to protect the patella throughout the case.    The distal femur was opened with the drill and the intramedullary distal femoral cutting jig was utilized, set at 5 degrees resecting 9 mm off the distal femur.  Care was taken to protect the collateral ligaments.  Then the extramedullary tibial cutting jig was utilized making the appropriate cut using the anterior tibial crest as a reference building in appropriate posterior slope.  Care was taken during the cut to protect the medial and collateral ligaments.  The proximal tibia was removed along with the posterior horns of the menisci.  The PCL was sacrificed.    The extensor gap was measured and found to have adequate resection, measuring to a size 6.    The distal femoral sizing jig was applied, taking care to avoid notching.  This was set at 3 degrees of external rotation.  Then the 4-in-1 cutting jig was applied and the anterior and posterior femur was cut, along with the chamfer cuts.  All posterior osteophytes were removed.  The flexion gap was then measured and was symmetric with the extension gap.  I completed the distal femoral preparation  using the appropriate jig to prepare the box.  The proximal tibia sized and prepared accordingly with the reamer and the punch, and then all components were trialed with the poly insert.  The knee was found to have excellent balance and full motion.    The above named components were then cemented into place  and all excess cement was removed.  The real polyethylene implant was placed.  After the cement had cured I released the tourniquet and confirmed excellent hemostasis with no major posterior vessel injury.    The knee was easily taken through a range of motion and the patella tracked well and the knee irrigated copiously and the parapatellar and subcutaneous tissue closed with vicryl, and monocryl with steri strips for the skin.  The wounds were injected with marcaine, and dressed with sterile gauze and the patient was awakened and returned to the PACU in stable and satisfactory condition.  There were no complications.  Total tourniquet time was about 75 minutes.

## 2022-11-19 NOTE — Transfer of Care (Signed)
Immediate Anesthesia Transfer of Care Note  Patient: Steven Austin.  Procedure(s) Performed: TOTAL KNEE ARTHROPLASTY (Right: Knee)  Patient Location: PACU  Anesthesia Type:Spinal  Level of Consciousness: awake, alert , and oriented  Airway & Oxygen Therapy: Patient Spontanous Breathing and Patient connected to face mask oxygen  Post-op Assessment: Report given to RN and Post -op Vital signs reviewed and stable  Post vital signs: Reviewed and stable  Last Vitals:  Vitals Value Taken Time  BP    Temp    Pulse 70 11/19/22 0955  Resp 16 11/19/22 0955  SpO2 100 % 11/19/22 0955  Vitals shown include unvalidated device data.  Last Pain:  Vitals:   11/19/22 0543  TempSrc: Oral  PainSc:       Patients Stated Pain Goal: 4 (50/38/88 2800)  Complications: No notable events documented.

## 2022-11-20 ENCOUNTER — Encounter (HOSPITAL_COMMUNITY): Payer: Self-pay | Admitting: Orthopedic Surgery

## 2022-11-20 ENCOUNTER — Other Ambulatory Visit (HOSPITAL_COMMUNITY): Payer: Self-pay

## 2022-11-20 DIAGNOSIS — Z85828 Personal history of other malignant neoplasm of skin: Secondary | ICD-10-CM | POA: Diagnosis not present

## 2022-11-20 DIAGNOSIS — Z79899 Other long term (current) drug therapy: Secondary | ICD-10-CM | POA: Diagnosis not present

## 2022-11-20 DIAGNOSIS — I10 Essential (primary) hypertension: Secondary | ICD-10-CM | POA: Diagnosis not present

## 2022-11-20 DIAGNOSIS — Z96652 Presence of left artificial knee joint: Secondary | ICD-10-CM | POA: Diagnosis not present

## 2022-11-20 DIAGNOSIS — M1711 Unilateral primary osteoarthritis, right knee: Secondary | ICD-10-CM | POA: Diagnosis not present

## 2022-11-20 DIAGNOSIS — Z87891 Personal history of nicotine dependence: Secondary | ICD-10-CM | POA: Diagnosis not present

## 2022-11-20 LAB — CBC
HCT: 36.3 % — ABNORMAL LOW (ref 39.0–52.0)
Hemoglobin: 12.3 g/dL — ABNORMAL LOW (ref 13.0–17.0)
MCH: 32.7 pg (ref 26.0–34.0)
MCHC: 33.9 g/dL (ref 30.0–36.0)
MCV: 96.5 fL (ref 80.0–100.0)
Platelets: 174 10*3/uL (ref 150–400)
RBC: 3.76 MIL/uL — ABNORMAL LOW (ref 4.22–5.81)
RDW: 12.3 % (ref 11.5–15.5)
WBC: 11.9 10*3/uL — ABNORMAL HIGH (ref 4.0–10.5)
nRBC: 0 % (ref 0.0–0.2)

## 2022-11-20 LAB — BASIC METABOLIC PANEL
Anion gap: 7 (ref 5–15)
BUN: 22 mg/dL (ref 8–23)
CO2: 20 mmol/L — ABNORMAL LOW (ref 22–32)
Calcium: 8.4 mg/dL — ABNORMAL LOW (ref 8.9–10.3)
Chloride: 106 mmol/L (ref 98–111)
Creatinine, Ser: 1.01 mg/dL (ref 0.61–1.24)
GFR, Estimated: >60 mL/min (ref >60)
Glucose, Bld: 132 mg/dL — ABNORMAL HIGH (ref 70–99)
Potassium: 3.8 mmol/L (ref 3.5–5.1)
Sodium: 133 mmol/L — ABNORMAL LOW (ref 135–145)

## 2022-11-20 LAB — SURGICAL PATHOLOGY

## 2022-11-20 MED ORDER — OXYCODONE HCL 5 MG PO TABS
5.0000 mg | ORAL_TABLET | ORAL | 0 refills | Status: AC | PRN
  Filled 2022-11-20: qty 30, 5d supply, fill #0

## 2022-11-20 MED ORDER — RIVAROXABAN 10 MG PO TABS
10.0000 mg | ORAL_TABLET | Freq: Every day | ORAL | 0 refills | Status: AC
  Filled 2022-11-20 (×2): qty 30, 30d supply, fill #0

## 2022-11-20 MED ORDER — SENNA-DOCUSATE SODIUM 8.6-50 MG PO TABS
2.0000 | ORAL_TABLET | Freq: Every day | ORAL | 0 refills | Status: AC
  Filled 2022-11-20: qty 30, 15d supply, fill #0

## 2022-11-20 MED ORDER — ACETAMINOPHEN 325 MG PO TABS
650.0000 mg | ORAL_TABLET | ORAL | 0 refills | Status: AC | PRN
  Filled 2022-11-20: qty 60, 5d supply, fill #0

## 2022-11-20 MED ORDER — ONDANSETRON 4 MG PO TBDP
4.0000 mg | ORAL_TABLET | Freq: Three times a day (TID) | ORAL | 0 refills | Status: AC | PRN
  Filled 2022-11-20: qty 10, 4d supply, fill #0

## 2022-11-20 NOTE — Progress Notes (Signed)
Physical Therapy Treatment Patient Details Name: Steven Austin. MRN: [id number] DOB: [birth year] Today's Date: 11/20/2022   History of Present Illness Pt is a 77yo male presenting s/p R-TKA on 11/19/22. PMH: HLD, HTN, OSA, L-TKA 2015.    PT Comments    Pt is progressing well. His wife was taken to ED d/t having a sz this am (wife has had szs x 14 years since TBI). Pt is still motivated to go home, will see again in pm. Pt reports his son will be able to take him home later today.    Recommendations for follow up therapy are one component of a multi-disciplinary discharge planning process, led by the attending physician.  Recommendations may be updated based on patient status, additional functional criteria and insurance authorization.  Follow Up Recommendations  Follow physician's recommendations for discharge plan and follow up therapies     Assistance Recommended at Discharge Frequent or constant Supervision/Assistance  Patient can return home with the following A little help with walking and/or transfers;A little help with bathing/dressing/bathroom;Assistance with cooking/housework;Help with stairs or ramp for entrance;Assist for transportation   Equipment Recommendations  None recommended by PT    Recommendations for Other Services       Precautions / Restrictions Precautions Precautions: Fall;Knee Precaution Booklet Issued: No Precaution Comments: no pillow under the knee Restrictions Weight Bearing Restrictions: No Other Position/Activity Restrictions: wbat     Mobility  Bed Mobility Overal bed mobility: Needs Assistance Bed Mobility: Supine to Sit     Supine to sit: Supervision, HOB elevated     General bed mobility comments: For safety only    Transfers Overall transfer level: Needs assistance Equipment used: Rolling walker (2 wheels) Transfers: Sit to/from Stand Sit to Stand: Min guard           General transfer comment: For safety; VCs for  sequencing, hand placement, and powering up through LLE.    Ambulation/Gait Ambulation/Gait assistance: Min guard, Supervision Gait Distance (Feet): 150 Feet Assistive device: Rolling walker (2 wheels) Gait Pattern/deviations: Step-to pattern, Step-through pattern Gait velocity: decreased     General Gait Details: Pt ambulated with RW and min guard with no overt LOB noted or physical assist required, VCs for proximity to device. min/guard to supervision for safety   Stairs             Wheelchair Mobility    Modified Rankin (Stroke Patients Only)       Balance                                            Cognition Arousal/Alertness: Awake/alert Behavior During Therapy: WFL for tasks assessed/performed Overall Cognitive Status: Within Functional Limits for tasks assessed                                          Exercises Total Joint Exercises Ankle Circles/Pumps: AROM, Both, 20 reps Quad Sets: AROM, Both, 5 reps Heel Slides: Right, 10 reps, AROM Straight Leg Raises: AROM, Right, 5 reps Long Arc Quad: AROM, Strengthening, 10 reps    General Comments        Pertinent Vitals/Pain Pain Assessment Pain Assessment: 0-10 Pain Score: 2  Pain Location: right knee Pain Descriptors / Indicators: Operative site guarding, Discomfort Pain Intervention(s): Limited activity within patient's  tolerance, Monitored during session, Premedicated before session, Repositioned, Ice applied    Home Living                          Prior Function            PT Goals (current goals can now be found in the care plan section) Acute Rehab PT Goals Patient Stated Goal: Walk further and faster, vacuuming more PT Goal Formulation: With patient Time For Goal Achievement: 11/26/22 Potential to Achieve Goals: Good Progress towards PT goals: Progressing toward goals    Frequency    7X/week      PT Plan      Co-evaluation               AM-PAC PT "6 Clicks" Mobility   Outcome Measure  Help needed turning from your back to your side while in a flat bed without using bedrails?: None Help needed moving from lying on your back to sitting on the side of a flat bed without using bedrails?: A Little Help needed moving to and from a bed to a chair (including a wheelchair)?: A Little Help needed standing up from a chair using your arms (e.g., wheelchair or bedside chair)?: A Little Help needed to walk in hospital room?: A Little Help needed climbing 3-5 steps with a railing? : A Little 6 Click Score: 19    End of Session Equipment Utilized During Treatment: Gait belt Activity Tolerance: Patient tolerated treatment well;No increased pain Patient left: in chair;with call bell/phone within reach;with chair alarm set Nurse Communication: Mobility status PT Visit Diagnosis: Difficulty in walking, not elsewhere classified (R26.2);Pain Pain - Right/Left: Right Pain - part of body: Knee     Time: 4142-3953 PT Time Calculation (min) (ACUTE ONLY): 19 min  Charges:  $Gait Training: 8-22 mins                     Baxter Flattery, PT  Acute Rehab Dept [facility]) [phone]  [facility] Weekend Pager [facility] only)  [phone]  11/20/2022    [facility] 11/20/2022, 10:48 AM

## 2022-11-20 NOTE — Discharge Summary (Signed)
Discharge Summary  Patient ID: Steven Austin. MRN: [id number] DOB/AGE: 1946-04-21 77 y.o.  Admit date: 11/19/2022 Discharge date: 11/20/2022  Admission Diagnoses:  S/P total knee arthroplasty, right  Discharge Diagnoses:  Principal Problem:   S/P total knee arthroplasty, right   Past Medical History:  Diagnosis Date   Abnormal EKG    LAFB, poor r wave progression   Arthritis    "knees" (01/24/2014)   Basal cell carcinoma    Complication of anesthesia    "bothered by ether in the [place]"    Diverticulosis    DJD (degenerative joint disease)    Hearing loss    History of colon polyps    History of irregular heartbeat    Hyperlipidemia    Hypertension    Left knee DJD    Sleep apnea    mild, sleep positioning, CPAP not ordered    Surgeries: Procedure(s): TOTAL KNEE ARTHROPLASTY on 11/19/2022   Consultants (if any):   Discharged Condition: Improved  Hospital Course: Yulian Gosney. is an 77 y.o. male who was admitted 11/19/2022 with a diagnosis of S/P total knee arthroplasty, right and went to the operating room on 11/19/2022 and underwent the above named procedures.    He was given perioperative antibiotics:  Anti-infectives (From admission, onward)    Start     Dose/Rate Route Frequency Ordered Stop   11/19/22 1500  ceFAZolin (ANCEF) IVPB 2g/100 mL premix        2 g 200 mL/hr over 30 Minutes Intravenous Every 6 hours 11/19/22 1421 11/19/22 2356   11/19/22 0600  ceFAZolin (ANCEF) IVPB 2g/100 mL premix        2 g 200 mL/hr over 30 Minutes Intravenous On call to O.R. 11/19/22 5638 11/19/22 0739     .  He was given sequential compression devices, early ambulation, and xarelto for DVT prophylaxis.  He benefited maximally from the hospital stay and there were no complications.    Recent vital signs:  Vitals:   11/20/22 0649 11/20/22 1257  BP: (!) 146/89 (!) 141/74  Pulse: 81 92  Resp: 17 18  Temp: 98.3 F (36.8 C) 98.4 F (36.9 C)  SpO2: 99% 97%     Recent laboratory studies:  Lab Results  Component Value Date   HGB 12.3 (L) 11/20/2022   HGB 14.7 11/08/2022   HGB 16.5 07/23/2018   Lab Results  Component Value Date   WBC 11.9 (H) 11/20/2022   PLT 174 11/20/2022   Lab Results  Component Value Date   INR 0.93 01/17/2014   Lab Results  Component Value Date   NA 133 (L) 11/20/2022   K 3.8 11/20/2022   CL 106 11/20/2022   CO2 20 (L) 11/20/2022   BUN 22 11/20/2022   CREATININE 1.01 11/20/2022   GLUCOSE 132 (H) 11/20/2022    Discharge Medications:   Allergies as of 11/20/2022       Reactions   Aspirin    Per Dr was told not to take    Augmentin [amoxicillin-pot Clavulanate] Rash   Has patient had a PCN reaction causing immediate rash, facial/tongue/throat swelling, SOB or lightheadedness with hypotension: No Has patient had a PCN reaction causing severe rash involving mucus membranes or skin necrosis: No Has patient had a PCN reaction that required hospitalization: No Has patient had a PCN reaction occurring within the last 10 years: No If all of the above answers are "NO", then may proceed with Cephalosporin use.  Medication List     TAKE these medications    acetaminophen 325 MG tablet Commonly known as: Tylenol Take 2 tablets (650 mg total) by mouth every 4 (four) hours as needed.   atorvastatin 20 MG tablet Commonly known as: LIPITOR Take 20 mg by mouth daily.   gemfibrozil 600 MG tablet Commonly known as: LOPID Take 600 mg by mouth 2 (two) times daily.   GLUCOSAMINE CHONDROITIN TRIPLE PO Take 1 tablet by mouth daily.   latanoprost 0.005 % ophthalmic solution Commonly known as: XALATAN Place 1 drop into both eyes at bedtime.   losartan 50 MG tablet Commonly known as: COZAAR Take 50 mg by mouth daily.   multivitamin with minerals Tabs tablet Take 1 tablet by mouth daily.   NIFEdipine 60 MG 24 hr tablet Commonly known as: ADALAT CC Take 60 mg by mouth daily.   ondansetron 4 MG  disintegrating tablet Commonly known as: ZOFRAN-ODT Dissolve 1 tablet (4 mg total) by mouth every 8 (eight) hours as needed for nausea or vomiting.   oxyCODONE 5 MG immediate release tablet Commonly known as: Roxicodone Take 1 tablet (5 mg total) by mouth every 4 (four) hours as needed for severe pain.   sennosides-docusate sodium 8.6-50 MG tablet Commonly known as: SENOKOT-S Take 2 tablets by mouth daily.   TERBINAFINE HCL EX Apply 1 drop topically at bedtime. Compounded medication, apply to affected toes at bedtime   Xarelto 10 MG Tabs tablet Generic drug: rivaroxaban Take 1 tablet (10 mg total) by mouth daily.        Diagnostic Studies: DG Knee Right Port  Result Date: 11/19/2022 CLINICAL DATA:  Status post total knee arthroplasty EXAM: PORTABLE RIGHT KNEE - 1-2 VIEW COMPARISON:  None Available. FINDINGS: Postsurgical changes of right total knee arthroplasty. Normal alignment thought evidence of periprosthetic fracture. Expected soft tissue changes including a joint effusion. IMPRESSION: Postsurgical changes of right total knee arthroplasty. No evidence of immediate hardware complication. Electronically Signed   By: Maurine Simmering M.D.   On: 11/19/2022 10:51    Disposition: Discharge disposition: 01-Home or Self Care          Follow-up Information     Marchia Bond, MD. Schedule an appointment as soon as possible for a visit in 2 week(s).   Specialty: Orthopedic Surgery Contact information: [address] [place] [address] [phone]                  Signed: Jola Baptist 11/20/2022, 2:11 PM

## 2022-11-20 NOTE — Anesthesia Postprocedure Evaluation (Signed)
Anesthesia Post Note  Patient: Steven Austin.  Procedure(s) Performed: TOTAL KNEE ARTHROPLASTY (Right: Knee)     Patient location during evaluation: PACU Anesthesia Type: Spinal Level of consciousness: awake, awake and alert and oriented Pain management: pain level controlled Vital Signs Assessment: post-procedure vital signs reviewed and stable Respiratory status: spontaneous breathing, nonlabored ventilation and respiratory function stable Cardiovascular status: blood pressure returned to baseline and stable Postop Assessment: no headache, no backache, spinal receding and no apparent nausea or vomiting Anesthetic complications: no   No notable events documented.  Last Vitals:  Vitals:   11/20/22 0128 11/20/22 0649  BP: (!) 121/92 (!) 146/89  Pulse: 92 81  Resp: 16 17  Temp: 36.7 C 36.8 C  SpO2: 98% 99%    Last Pain:  Vitals:   11/20/22 0649  TempSrc:   PainSc: 0-No pain                 Santa Lighter

## 2022-11-20 NOTE — TOC Transition Note (Signed)
Transition of Care [facility]) - CM/SW Discharge Note  Patient Details  Name: Steven Austin. MRN: [id number] Date of Birth: Dec 25, 1945  Transition of Care [facility]) CM/SW Contact:  Sherie Don, LCSW Phone Number: 11/20/2022, 2:36 PM  Clinical Narrative: Patient is expected to discharge home after working with PT. CSW met with patient to confirm discharge plan. Patient will go home with HHPT, which was prearranged with [place]. Patient has a rolling walker at home, so there are no DME needs at this time. TOC signing off.    Final next level of care: Home w Home Health Services Barriers to Discharge: No Barriers Identified  Patient Goals and CMS Choice CMS Medicare.gov Compare Post Acute Care list provided to:: Patient Choice offered to / list presented to : NA  Discharge Plan and Services Additional resources added to the After Visit Summary for          DME Arranged: N/A DME Agency: NA HH Arranged: PT HH Agency: [place] Representative spoke with at [place] in orthopedist's office  Social Determinants of Health ([place]) Interventions SDOH Screenings   Food Insecurity: Unknown (11/19/2022)  Housing: Low Risk  (11/19/2022)  Transportation Needs: Unknown (11/19/2022)  Utilities: Unknown (11/19/2022)  Tobacco Use: Medium Risk (11/20/2022)   Readmission Risk Interventions     No data to display

## 2022-11-20 NOTE — Progress Notes (Signed)
Physical Therapy Treatment Patient Details Name: Steven Austin. MRN: [id number] DOB: 1946-07-15 Today's Date: 11/20/2022   History of Present Illness Pt is a 77yo male presenting s/p R-TKA on 11/19/22. PMH: HLD, HTN, OSA, L-TKA 2015.    PT Comments    Pt progressing well this pm, reviewed areas below. Pt remains motivated to d/c home. Has hired some additional assist for a few days. Pain controlled, pt overall supervision to mod I with mobility. Ready to d/c with caregiver assist as needed from PT standpoint.    Recommendations for follow up therapy are one component of a multi-disciplinary discharge planning process, led by the attending physician.  Recommendations may be updated based on patient status, additional functional criteria and insurance authorization.  Follow Up Recommendations  Follow physician's recommendations for discharge plan and follow up therapies     Assistance Recommended at Discharge Frequent or constant Supervision/Assistance  Patient can return home with the following A little help with walking and/or transfers;A little help with bathing/dressing/bathroom;Assistance with cooking/housework;Help with stairs or ramp for entrance;Assist for transportation   Equipment Recommendations  None recommended by PT    Recommendations for Other Services       Precautions / Restrictions Precautions Precautions: Fall;Knee Precaution Booklet Issued: No Precaution Comments: no pillow under the knee Restrictions Weight Bearing Restrictions: No Other Position/Activity Restrictions: wbat     Mobility  Bed Mobility Overal bed mobility: Needs Assistance Bed Mobility: Supine to Sit, Sit to Supine     Supine to sit: HOB elevated, Modified independent (Device/Increase time) Sit to supine: Modified independent (Device/Increase time)   General bed mobility comments: For safety only    Transfers Overall transfer level: Needs assistance Equipment used: Rolling  walker (2 wheels) Transfers: Sit to/from Stand Sit to Stand: Supervision           General transfer comment: For safety; VCs for sequencing, hand placement, and powering up through LLE.    Ambulation/Gait Ambulation/Gait assistance: Supervision, Modified independent (Device/Increase time) Gait Distance (Feet): 120 Feet Assistive device: Rolling walker (2 wheels) Gait Pattern/deviations: Step-to pattern, Step-through pattern Gait velocity: decreased     General Gait Details: supervision for safety, cues initially for sequence,  good stability, no LOB or knee buckling   Stairs Stairs: Yes Stairs assistance: Min guard, Supervision Stair Management: Step to pattern, Sideways, One rail Right, One rail Left Number of Stairs: 5 (x2) General stair comments: cues for sequence and technique, good stability, no knee buckling or LOB. pt self cues on second trial   Wheelchair Mobility    Modified Rankin (Stroke Patients Only)       Balance                                            Cognition Arousal/Alertness: Awake/alert Behavior During Therapy: WFL for tasks assessed/performed Overall Cognitive Status: Within Functional Limits for tasks assessed                                          Exercises Total Joint Exercises Ankle Circles/Pumps: AROM, Both, 20 reps Quad Sets: AROM, Both, 5 reps Heel Slides: Right, 10 reps, AROM Straight Leg Raises: AROM, Right, 5 reps Long Arc Quad: AROM, Strengthening, 10 reps    General Comments  Pertinent Vitals/Pain Pain Assessment Pain Assessment: 0-10 Pain Score: 2  Pain Location: right knee Pain Descriptors / Indicators: Operative site guarding, Discomfort Pain Intervention(s): Limited activity within patient's tolerance, Monitored during session, Ice applied    Home Living                          Prior Function            PT Goals (current goals can now be found in  the care plan section) Acute Rehab PT Goals Patient Stated Goal: Walk further and faster, vacuuming more PT Goal Formulation: With patient Time For Goal Achievement: 11/26/22 Potential to Achieve Goals: Good Progress towards PT goals: Progressing toward goals    Frequency    7X/week      PT Plan Current plan remains appropriate    Co-evaluation              AM-PAC PT "6 Clicks" Mobility   Outcome Measure  Help needed turning from your back to your side while in a flat bed without using bedrails?: None Help needed moving from lying on your back to sitting on the side of a flat bed without using bedrails?: A Little Help needed moving to and from a bed to a chair (including a wheelchair)?: A Little Help needed standing up from a chair using your arms (e.g., wheelchair or bedside chair)?: A Little Help needed to walk in hospital room?: A Little Help needed climbing 3-5 steps with a railing? : A Little 6 Click Score: 19    End of Session Equipment Utilized During Treatment: Gait belt Activity Tolerance: Patient tolerated treatment well;No increased pain Patient left: in chair;with call bell/phone within reach;with chair alarm set Nurse Communication: Mobility status PT Visit Diagnosis: Difficulty in walking, not elsewhere classified (R26.2);Pain Pain - Right/Left: Right Pain - part of body: Knee     Time: 9892-1194 PT Time Calculation (min) (ACUTE ONLY): 14 min  Charges:  $Gait Training: 8-22 mins                     Baxter Flattery, PT  Acute Rehab Dept [facility]) [phone]  [facility] Weekend Pager [facility] only)  [phone]  11/20/2022    [facility] 11/20/2022, 3:03 PM

## 2022-11-20 NOTE — Progress Notes (Signed)
Subjective: 1 Day Post-Op s/p Procedure(s): TOTAL KNEE ARTHROPLASTY   Patient is alert, oriented. Pain increased around midnight but resolved with pain medication and patient was able to sleep. Patient reports pain as well controlled this morning. Foley removed, not voiding yet, no BM yet.  Denies chest pain, SOB, Calf pain. No nausea/vomiting. No other complaints.   Objective:  PE: VITALS:   Vitals:   11/19/22 1602 11/19/22 2109 11/20/22 0128 11/20/22 0649  BP: (!) 154/97 110/78 (!) 121/92 (!) 146/89  Pulse: 98 97 92 81  Resp: '17 17 16 17  '$ Temp: 98.4 F (36.9 C) 98.4 F (36.9 C) 98 F (36.7 C) 98.3 F (36.8 C)  TempSrc: Oral Oral Oral   SpO2: 97% 97% 98% 99%  Weight:      Height:       General: sitting up in bed, in no acute distress Resp: normal respiratory effort Gi: soft, nontender MSK:  Sensation intact distally Intact pulses distally Dorsiflexion/Plantar flexion intact Incision: moderate drainage  LABS  Results for orders placed or performed during the hospital encounter of 11/19/22 (from the past 24 hour(s))  CBC     Status: Abnormal   Collection Time: 11/20/22  3:45 AM  Result Value Ref Range   WBC 11.9 (H) 4.0 - 10.5 K/uL   RBC 3.76 (L) 4.22 - 5.81 MIL/uL   Hemoglobin 12.3 (L) 13.0 - 17.0 g/dL   HCT 36.3 (L) 39.0 - 52.0 %   MCV 96.5 80.0 - 100.0 fL   MCH 32.7 26.0 - 34.0 pg   MCHC 33.9 30.0 - 36.0 g/dL   RDW 12.3 11.5 - 15.5 %   Platelets 174 150 - 400 K/uL   nRBC 0.0 0.0 - 0.2 %  Basic metabolic panel     Status: Abnormal   Collection Time: 11/20/22  3:45 AM  Result Value Ref Range   Sodium 133 (L) 135 - 145 mmol/L   Potassium 3.8 3.5 - 5.1 mmol/L   Chloride 106 98 - 111 mmol/L   CO2 20 (L) 22 - 32 mmol/L   Glucose, Bld 132 (H) 70 - 99 mg/dL   BUN 22 8 - 23 mg/dL   Creatinine, Ser 1.01 0.61 - 1.24 mg/dL   Calcium 8.4 (L) 8.9 - 10.3 mg/dL   GFR, Estimated >60 >60 mL/min   Anion gap 7 5 - 15    DG Knee Right Port  Result Date:  11/19/2022 CLINICAL DATA:  Status post total knee arthroplasty EXAM: PORTABLE RIGHT KNEE - 1-2 VIEW COMPARISON:  None Available. FINDINGS: Postsurgical changes of right total knee arthroplasty. Normal alignment thought evidence of periprosthetic fracture. Expected soft tissue changes including a joint effusion. IMPRESSION: Postsurgical changes of right total knee arthroplasty. No evidence of immediate hardware complication. Electronically Signed   By: Maurine Simmering M.D.   On: 11/19/2022 10:51    Assessment/Plan: Principal Problem:   S/P total knee arthroplasty, right   1 Day Post-Op s/p Procedure(s): TOTAL KNEE ARTHROPLASTY  Weightbearing: WBAT RLE, up with therapy Insicional and dressing care: Moderate bloody drainage on dressing, new aquacell placed this morning. Ok to change again before discharge if more drainage seen on bandage after working with PT. VTE prophylaxis: Xarelto x 30 days, patient has ASA allergy Pain control: continue current regimen Follow - up plan: 2 weeks with Dr. Mardelle Matte Dispo: pending further progress with PT, likely discharge home this evening if able to pass stair training  Contact information:   Merlene Pulling, PA-C [id number]  8-5  After hours and holidays please check [email or web address] for group call information for [facility]  Ventura Bruns 11/20/2022, 8:48 AM

## 2022-11-20 NOTE — Plan of Care (Signed)
Pt ready to DC home with family. 

## 2022-11-20 NOTE — Plan of Care (Signed)
Plan of care reviewed and discussed. °

## 2022-11-22 DIAGNOSIS — I10 Essential (primary) hypertension: Secondary | ICD-10-CM | POA: Diagnosis not present

## 2022-11-22 DIAGNOSIS — C4491 Basal cell carcinoma of skin, unspecified: Secondary | ICD-10-CM | POA: Diagnosis not present

## 2022-11-22 DIAGNOSIS — Z7901 Long term (current) use of anticoagulants: Secondary | ICD-10-CM | POA: Diagnosis not present

## 2022-11-22 DIAGNOSIS — Z96651 Presence of right artificial knee joint: Secondary | ICD-10-CM | POA: Diagnosis not present

## 2022-11-22 DIAGNOSIS — Z9181 History of falling: Secondary | ICD-10-CM | POA: Diagnosis not present

## 2022-11-22 DIAGNOSIS — M1990 Unspecified osteoarthritis, unspecified site: Secondary | ICD-10-CM | POA: Diagnosis not present

## 2022-11-22 DIAGNOSIS — Z471 Aftercare following joint replacement surgery: Secondary | ICD-10-CM | POA: Diagnosis not present

## 2022-11-22 DIAGNOSIS — E785 Hyperlipidemia, unspecified: Secondary | ICD-10-CM | POA: Diagnosis not present

## 2022-11-22 DIAGNOSIS — H9190 Unspecified hearing loss, unspecified ear: Secondary | ICD-10-CM | POA: Diagnosis not present

## 2022-11-22 DIAGNOSIS — K5790 Diverticulosis of intestine, part unspecified, without perforation or abscess without bleeding: Secondary | ICD-10-CM | POA: Diagnosis not present

## 2022-11-22 DIAGNOSIS — Z87891 Personal history of nicotine dependence: Secondary | ICD-10-CM | POA: Diagnosis not present

## 2022-11-22 DIAGNOSIS — Z96652 Presence of left artificial knee joint: Secondary | ICD-10-CM | POA: Diagnosis not present

## 2022-11-22 DIAGNOSIS — G4730 Sleep apnea, unspecified: Secondary | ICD-10-CM | POA: Diagnosis not present

## 2022-11-24 DIAGNOSIS — K5790 Diverticulosis of intestine, part unspecified, without perforation or abscess without bleeding: Secondary | ICD-10-CM | POA: Diagnosis not present

## 2022-11-24 DIAGNOSIS — I10 Essential (primary) hypertension: Secondary | ICD-10-CM | POA: Diagnosis not present

## 2022-11-24 DIAGNOSIS — Z96651 Presence of right artificial knee joint: Secondary | ICD-10-CM | POA: Diagnosis not present

## 2022-11-24 DIAGNOSIS — Z471 Aftercare following joint replacement surgery: Secondary | ICD-10-CM | POA: Diagnosis not present

## 2022-11-24 DIAGNOSIS — G4730 Sleep apnea, unspecified: Secondary | ICD-10-CM | POA: Diagnosis not present

## 2022-11-24 DIAGNOSIS — M1990 Unspecified osteoarthritis, unspecified site: Secondary | ICD-10-CM | POA: Diagnosis not present

## 2022-11-26 DIAGNOSIS — Z96651 Presence of right artificial knee joint: Secondary | ICD-10-CM | POA: Diagnosis not present

## 2022-11-26 DIAGNOSIS — Z471 Aftercare following joint replacement surgery: Secondary | ICD-10-CM | POA: Diagnosis not present

## 2022-11-26 DIAGNOSIS — I10 Essential (primary) hypertension: Secondary | ICD-10-CM | POA: Diagnosis not present

## 2022-11-26 DIAGNOSIS — G4730 Sleep apnea, unspecified: Secondary | ICD-10-CM | POA: Diagnosis not present

## 2022-11-26 DIAGNOSIS — K5790 Diverticulosis of intestine, part unspecified, without perforation or abscess without bleeding: Secondary | ICD-10-CM | POA: Diagnosis not present

## 2022-11-26 DIAGNOSIS — M1990 Unspecified osteoarthritis, unspecified site: Secondary | ICD-10-CM | POA: Diagnosis not present

## 2022-11-28 DIAGNOSIS — M1990 Unspecified osteoarthritis, unspecified site: Secondary | ICD-10-CM | POA: Diagnosis not present

## 2022-11-28 DIAGNOSIS — Z96651 Presence of right artificial knee joint: Secondary | ICD-10-CM | POA: Diagnosis not present

## 2022-11-28 DIAGNOSIS — I10 Essential (primary) hypertension: Secondary | ICD-10-CM | POA: Diagnosis not present

## 2022-11-28 DIAGNOSIS — K5790 Diverticulosis of intestine, part unspecified, without perforation or abscess without bleeding: Secondary | ICD-10-CM | POA: Diagnosis not present

## 2022-11-28 DIAGNOSIS — Z471 Aftercare following joint replacement surgery: Secondary | ICD-10-CM | POA: Diagnosis not present

## 2022-11-28 DIAGNOSIS — G4730 Sleep apnea, unspecified: Secondary | ICD-10-CM | POA: Diagnosis not present

## 2022-11-30 DIAGNOSIS — I10 Essential (primary) hypertension: Secondary | ICD-10-CM | POA: Diagnosis not present

## 2022-11-30 DIAGNOSIS — G4730 Sleep apnea, unspecified: Secondary | ICD-10-CM | POA: Diagnosis not present

## 2022-11-30 DIAGNOSIS — Z471 Aftercare following joint replacement surgery: Secondary | ICD-10-CM | POA: Diagnosis not present

## 2022-11-30 DIAGNOSIS — K5790 Diverticulosis of intestine, part unspecified, without perforation or abscess without bleeding: Secondary | ICD-10-CM | POA: Diagnosis not present

## 2022-11-30 DIAGNOSIS — M1990 Unspecified osteoarthritis, unspecified site: Secondary | ICD-10-CM | POA: Diagnosis not present

## 2022-11-30 DIAGNOSIS — Z96651 Presence of right artificial knee joint: Secondary | ICD-10-CM | POA: Diagnosis not present

## 2022-12-02 DIAGNOSIS — M1711 Unilateral primary osteoarthritis, right knee: Secondary | ICD-10-CM | POA: Diagnosis not present

## 2022-12-04 DIAGNOSIS — G4730 Sleep apnea, unspecified: Secondary | ICD-10-CM | POA: Diagnosis not present

## 2022-12-04 DIAGNOSIS — I10 Essential (primary) hypertension: Secondary | ICD-10-CM | POA: Diagnosis not present

## 2022-12-04 DIAGNOSIS — Z96651 Presence of right artificial knee joint: Secondary | ICD-10-CM | POA: Diagnosis not present

## 2022-12-04 DIAGNOSIS — M1990 Unspecified osteoarthritis, unspecified site: Secondary | ICD-10-CM | POA: Diagnosis not present

## 2022-12-04 DIAGNOSIS — K5790 Diverticulosis of intestine, part unspecified, without perforation or abscess without bleeding: Secondary | ICD-10-CM | POA: Diagnosis not present

## 2022-12-04 DIAGNOSIS — Z471 Aftercare following joint replacement surgery: Secondary | ICD-10-CM | POA: Diagnosis not present

## 2022-12-17 DIAGNOSIS — R262 Difficulty in walking, not elsewhere classified: Secondary | ICD-10-CM | POA: Diagnosis not present

## 2022-12-17 DIAGNOSIS — M25661 Stiffness of right knee, not elsewhere classified: Secondary | ICD-10-CM | POA: Diagnosis not present

## 2022-12-17 DIAGNOSIS — M1711 Unilateral primary osteoarthritis, right knee: Secondary | ICD-10-CM | POA: Diagnosis not present

## 2022-12-17 DIAGNOSIS — M6281 Muscle weakness (generalized): Secondary | ICD-10-CM | POA: Diagnosis not present

## 2022-12-25 DIAGNOSIS — M6281 Muscle weakness (generalized): Secondary | ICD-10-CM | POA: Diagnosis not present

## 2022-12-25 DIAGNOSIS — R262 Difficulty in walking, not elsewhere classified: Secondary | ICD-10-CM | POA: Diagnosis not present

## 2022-12-25 DIAGNOSIS — M1711 Unilateral primary osteoarthritis, right knee: Secondary | ICD-10-CM | POA: Diagnosis not present

## 2022-12-25 DIAGNOSIS — M25661 Stiffness of right knee, not elsewhere classified: Secondary | ICD-10-CM | POA: Diagnosis not present

## 2022-12-31 DIAGNOSIS — M1711 Unilateral primary osteoarthritis, right knee: Secondary | ICD-10-CM | POA: Diagnosis not present

## 2023-01-01 DIAGNOSIS — M6281 Muscle weakness (generalized): Secondary | ICD-10-CM | POA: Diagnosis not present

## 2023-01-01 DIAGNOSIS — M25661 Stiffness of right knee, not elsewhere classified: Secondary | ICD-10-CM | POA: Diagnosis not present

## 2023-01-01 DIAGNOSIS — M1711 Unilateral primary osteoarthritis, right knee: Secondary | ICD-10-CM | POA: Diagnosis not present

## 2023-01-01 DIAGNOSIS — R262 Difficulty in walking, not elsewhere classified: Secondary | ICD-10-CM | POA: Diagnosis not present

## 2023-01-03 DIAGNOSIS — M1711 Unilateral primary osteoarthritis, right knee: Secondary | ICD-10-CM | POA: Diagnosis not present

## 2023-01-03 DIAGNOSIS — M25661 Stiffness of right knee, not elsewhere classified: Secondary | ICD-10-CM | POA: Diagnosis not present

## 2023-01-03 DIAGNOSIS — M6281 Muscle weakness (generalized): Secondary | ICD-10-CM | POA: Diagnosis not present

## 2023-01-03 DIAGNOSIS — R262 Difficulty in walking, not elsewhere classified: Secondary | ICD-10-CM | POA: Diagnosis not present

## 2023-01-07 DIAGNOSIS — R262 Difficulty in walking, not elsewhere classified: Secondary | ICD-10-CM | POA: Diagnosis not present

## 2023-01-07 DIAGNOSIS — M1711 Unilateral primary osteoarthritis, right knee: Secondary | ICD-10-CM | POA: Diagnosis not present

## 2023-01-07 DIAGNOSIS — M6281 Muscle weakness (generalized): Secondary | ICD-10-CM | POA: Diagnosis not present

## 2023-01-07 DIAGNOSIS — M25661 Stiffness of right knee, not elsewhere classified: Secondary | ICD-10-CM | POA: Diagnosis not present

## 2023-01-09 DIAGNOSIS — M6281 Muscle weakness (generalized): Secondary | ICD-10-CM | POA: Diagnosis not present

## 2023-01-09 DIAGNOSIS — M1711 Unilateral primary osteoarthritis, right knee: Secondary | ICD-10-CM | POA: Diagnosis not present

## 2023-01-09 DIAGNOSIS — M25661 Stiffness of right knee, not elsewhere classified: Secondary | ICD-10-CM | POA: Diagnosis not present

## 2023-01-09 DIAGNOSIS — R262 Difficulty in walking, not elsewhere classified: Secondary | ICD-10-CM | POA: Diagnosis not present

## 2023-01-14 DIAGNOSIS — M25661 Stiffness of right knee, not elsewhere classified: Secondary | ICD-10-CM | POA: Diagnosis not present

## 2023-01-14 DIAGNOSIS — M6281 Muscle weakness (generalized): Secondary | ICD-10-CM | POA: Diagnosis not present

## 2023-01-14 DIAGNOSIS — R262 Difficulty in walking, not elsewhere classified: Secondary | ICD-10-CM | POA: Diagnosis not present

## 2023-01-14 DIAGNOSIS — M1711 Unilateral primary osteoarthritis, right knee: Secondary | ICD-10-CM | POA: Diagnosis not present

## 2023-01-15 DIAGNOSIS — Z08 Encounter for follow-up examination after completed treatment for malignant neoplasm: Secondary | ICD-10-CM | POA: Diagnosis not present

## 2023-01-15 DIAGNOSIS — D225 Melanocytic nevi of trunk: Secondary | ICD-10-CM | POA: Diagnosis not present

## 2023-01-15 DIAGNOSIS — Z86006 Personal history of melanoma in-situ: Secondary | ICD-10-CM | POA: Diagnosis not present

## 2023-01-15 DIAGNOSIS — Z1283 Encounter for screening for malignant neoplasm of skin: Secondary | ICD-10-CM | POA: Diagnosis not present

## 2023-01-16 DIAGNOSIS — R262 Difficulty in walking, not elsewhere classified: Secondary | ICD-10-CM | POA: Diagnosis not present

## 2023-01-16 DIAGNOSIS — M25661 Stiffness of right knee, not elsewhere classified: Secondary | ICD-10-CM | POA: Diagnosis not present

## 2023-01-16 DIAGNOSIS — M6281 Muscle weakness (generalized): Secondary | ICD-10-CM | POA: Diagnosis not present

## 2023-01-16 DIAGNOSIS — M1711 Unilateral primary osteoarthritis, right knee: Secondary | ICD-10-CM | POA: Diagnosis not present

## 2023-01-21 DIAGNOSIS — R262 Difficulty in walking, not elsewhere classified: Secondary | ICD-10-CM | POA: Diagnosis not present

## 2023-01-21 DIAGNOSIS — M25661 Stiffness of right knee, not elsewhere classified: Secondary | ICD-10-CM | POA: Diagnosis not present

## 2023-01-21 DIAGNOSIS — M6281 Muscle weakness (generalized): Secondary | ICD-10-CM | POA: Diagnosis not present

## 2023-01-21 DIAGNOSIS — M1711 Unilateral primary osteoarthritis, right knee: Secondary | ICD-10-CM | POA: Diagnosis not present

## 2023-01-23 DIAGNOSIS — M6281 Muscle weakness (generalized): Secondary | ICD-10-CM | POA: Diagnosis not present

## 2023-01-23 DIAGNOSIS — M1711 Unilateral primary osteoarthritis, right knee: Secondary | ICD-10-CM | POA: Diagnosis not present

## 2023-01-23 DIAGNOSIS — M25661 Stiffness of right knee, not elsewhere classified: Secondary | ICD-10-CM | POA: Diagnosis not present

## 2023-01-23 DIAGNOSIS — R262 Difficulty in walking, not elsewhere classified: Secondary | ICD-10-CM | POA: Diagnosis not present

## 2023-01-29 DIAGNOSIS — M1711 Unilateral primary osteoarthritis, right knee: Secondary | ICD-10-CM | POA: Diagnosis not present

## 2023-01-29 DIAGNOSIS — H43811 Vitreous degeneration, right eye: Secondary | ICD-10-CM | POA: Diagnosis not present

## 2023-01-29 DIAGNOSIS — H524 Presbyopia: Secondary | ICD-10-CM | POA: Diagnosis not present

## 2023-01-29 DIAGNOSIS — H2513 Age-related nuclear cataract, bilateral: Secondary | ICD-10-CM | POA: Diagnosis not present

## 2023-01-30 DIAGNOSIS — R262 Difficulty in walking, not elsewhere classified: Secondary | ICD-10-CM | POA: Diagnosis not present

## 2023-01-30 DIAGNOSIS — M1711 Unilateral primary osteoarthritis, right knee: Secondary | ICD-10-CM | POA: Diagnosis not present

## 2023-01-30 DIAGNOSIS — M25661 Stiffness of right knee, not elsewhere classified: Secondary | ICD-10-CM | POA: Diagnosis not present

## 2023-01-30 DIAGNOSIS — M6281 Muscle weakness (generalized): Secondary | ICD-10-CM | POA: Diagnosis not present

## 2023-02-03 DIAGNOSIS — I10 Essential (primary) hypertension: Secondary | ICD-10-CM | POA: Diagnosis not present

## 2023-02-04 DIAGNOSIS — M1711 Unilateral primary osteoarthritis, right knee: Secondary | ICD-10-CM | POA: Diagnosis not present

## 2023-02-04 DIAGNOSIS — M25661 Stiffness of right knee, not elsewhere classified: Secondary | ICD-10-CM | POA: Diagnosis not present

## 2023-02-04 DIAGNOSIS — M6281 Muscle weakness (generalized): Secondary | ICD-10-CM | POA: Diagnosis not present

## 2023-02-04 DIAGNOSIS — R262 Difficulty in walking, not elsewhere classified: Secondary | ICD-10-CM | POA: Diagnosis not present

## 2023-02-05 DIAGNOSIS — I10 Essential (primary) hypertension: Secondary | ICD-10-CM | POA: Diagnosis not present

## 2023-02-05 DIAGNOSIS — Z6828 Body mass index (BMI) 28.0-28.9, adult: Secondary | ICD-10-CM | POA: Diagnosis not present

## 2023-02-06 DIAGNOSIS — M25661 Stiffness of right knee, not elsewhere classified: Secondary | ICD-10-CM | POA: Diagnosis not present

## 2023-02-06 DIAGNOSIS — M6281 Muscle weakness (generalized): Secondary | ICD-10-CM | POA: Diagnosis not present

## 2023-02-06 DIAGNOSIS — M1711 Unilateral primary osteoarthritis, right knee: Secondary | ICD-10-CM | POA: Diagnosis not present

## 2023-02-06 DIAGNOSIS — R262 Difficulty in walking, not elsewhere classified: Secondary | ICD-10-CM | POA: Diagnosis not present

## 2023-02-18 DIAGNOSIS — H43811 Vitreous degeneration, right eye: Secondary | ICD-10-CM | POA: Diagnosis not present

## 2023-02-18 DIAGNOSIS — H40023 Open angle with borderline findings, high risk, bilateral: Secondary | ICD-10-CM | POA: Diagnosis not present

## 2023-02-18 DIAGNOSIS — H2513 Age-related nuclear cataract, bilateral: Secondary | ICD-10-CM | POA: Diagnosis not present

## 2023-06-04 DIAGNOSIS — M1711 Unilateral primary osteoarthritis, right knee: Secondary | ICD-10-CM | POA: Diagnosis not present

## 2023-07-30 DIAGNOSIS — Z85820 Personal history of malignant melanoma of skin: Secondary | ICD-10-CM | POA: Diagnosis not present

## 2023-07-30 DIAGNOSIS — L905 Scar conditions and fibrosis of skin: Secondary | ICD-10-CM | POA: Diagnosis not present

## 2023-07-30 DIAGNOSIS — Z08 Encounter for follow-up examination after completed treatment for malignant neoplasm: Secondary | ICD-10-CM | POA: Diagnosis not present

## 2023-07-30 DIAGNOSIS — Z1283 Encounter for screening for malignant neoplasm of skin: Secondary | ICD-10-CM | POA: Diagnosis not present

## 2023-07-30 DIAGNOSIS — D225 Melanocytic nevi of trunk: Secondary | ICD-10-CM | POA: Diagnosis not present

## 2023-07-30 DIAGNOSIS — D485 Neoplasm of uncertain behavior of skin: Secondary | ICD-10-CM | POA: Diagnosis not present

## 2023-08-12 DIAGNOSIS — Z125 Encounter for screening for malignant neoplasm of prostate: Secondary | ICD-10-CM | POA: Diagnosis not present

## 2023-08-12 DIAGNOSIS — I10 Essential (primary) hypertension: Secondary | ICD-10-CM | POA: Diagnosis not present

## 2023-08-12 DIAGNOSIS — R7301 Impaired fasting glucose: Secondary | ICD-10-CM | POA: Diagnosis not present

## 2023-08-12 DIAGNOSIS — N400 Enlarged prostate without lower urinary tract symptoms: Secondary | ICD-10-CM | POA: Diagnosis not present

## 2023-08-12 DIAGNOSIS — Z0000 Encounter for general adult medical examination without abnormal findings: Secondary | ICD-10-CM | POA: Diagnosis not present

## 2023-08-12 DIAGNOSIS — E782 Mixed hyperlipidemia: Secondary | ICD-10-CM | POA: Diagnosis not present

## 2023-08-12 DIAGNOSIS — R809 Proteinuria, unspecified: Secondary | ICD-10-CM | POA: Diagnosis not present

## 2023-08-13 DIAGNOSIS — S32030A Wedge compression fracture of third lumbar vertebra, initial encounter for closed fracture: Secondary | ICD-10-CM | POA: Diagnosis not present

## 2023-08-14 DIAGNOSIS — Z23 Encounter for immunization: Secondary | ICD-10-CM | POA: Diagnosis not present

## 2023-08-14 DIAGNOSIS — E782 Mixed hyperlipidemia: Secondary | ICD-10-CM | POA: Diagnosis not present

## 2023-08-14 DIAGNOSIS — R7301 Impaired fasting glucose: Secondary | ICD-10-CM | POA: Diagnosis not present

## 2023-08-14 DIAGNOSIS — S32000A Wedge compression fracture of unspecified lumbar vertebra, initial encounter for closed fracture: Secondary | ICD-10-CM | POA: Diagnosis not present

## 2023-08-14 DIAGNOSIS — R809 Proteinuria, unspecified: Secondary | ICD-10-CM | POA: Diagnosis not present

## 2023-08-14 DIAGNOSIS — I10 Essential (primary) hypertension: Secondary | ICD-10-CM | POA: Diagnosis not present

## 2023-08-14 DIAGNOSIS — Z0000 Encounter for general adult medical examination without abnormal findings: Secondary | ICD-10-CM | POA: Diagnosis not present

## 2023-08-19 ENCOUNTER — Other Ambulatory Visit: Payer: Self-pay | Admitting: Family Medicine

## 2023-08-19 DIAGNOSIS — S32000A Wedge compression fracture of unspecified lumbar vertebra, initial encounter for closed fracture: Secondary | ICD-10-CM

## 2023-08-26 DIAGNOSIS — H25013 Cortical age-related cataract, bilateral: Secondary | ICD-10-CM | POA: Diagnosis not present

## 2023-08-26 DIAGNOSIS — H40023 Open angle with borderline findings, high risk, bilateral: Secondary | ICD-10-CM | POA: Diagnosis not present

## 2023-08-26 DIAGNOSIS — H5202 Hypermetropia, left eye: Secondary | ICD-10-CM | POA: Diagnosis not present

## 2023-08-26 DIAGNOSIS — H2513 Age-related nuclear cataract, bilateral: Secondary | ICD-10-CM | POA: Diagnosis not present

## 2023-08-26 DIAGNOSIS — H524 Presbyopia: Secondary | ICD-10-CM | POA: Diagnosis not present

## 2023-08-28 DIAGNOSIS — R809 Proteinuria, unspecified: Secondary | ICD-10-CM | POA: Diagnosis not present

## 2023-09-10 DIAGNOSIS — S32030D Wedge compression fracture of third lumbar vertebra, subsequent encounter for fracture with routine healing: Secondary | ICD-10-CM | POA: Diagnosis not present

## 2023-10-01 DIAGNOSIS — Z23 Encounter for immunization: Secondary | ICD-10-CM | POA: Diagnosis not present

## 2023-11-17 DIAGNOSIS — M25561 Pain in right knee: Secondary | ICD-10-CM | POA: Diagnosis not present

## 2023-12-10 DIAGNOSIS — H40023 Open angle with borderline findings, high risk, bilateral: Secondary | ICD-10-CM | POA: Diagnosis not present

## 2023-12-23 DIAGNOSIS — Z09 Encounter for follow-up examination after completed treatment for conditions other than malignant neoplasm: Secondary | ICD-10-CM | POA: Diagnosis not present

## 2023-12-23 DIAGNOSIS — K5730 Diverticulosis of large intestine without perforation or abscess without bleeding: Secondary | ICD-10-CM | POA: Diagnosis not present

## 2023-12-23 DIAGNOSIS — K633 Ulcer of intestine: Secondary | ICD-10-CM | POA: Diagnosis not present

## 2023-12-23 DIAGNOSIS — Z860101 Personal history of adenomatous and serrated colon polyps: Secondary | ICD-10-CM | POA: Diagnosis not present

## 2023-12-23 DIAGNOSIS — D124 Benign neoplasm of descending colon: Secondary | ICD-10-CM | POA: Diagnosis not present

## 2023-12-23 DIAGNOSIS — D123 Benign neoplasm of transverse colon: Secondary | ICD-10-CM | POA: Diagnosis not present

## 2023-12-23 DIAGNOSIS — K648 Other hemorrhoids: Secondary | ICD-10-CM | POA: Diagnosis not present

## 2023-12-26 DIAGNOSIS — D123 Benign neoplasm of transverse colon: Secondary | ICD-10-CM | POA: Diagnosis not present

## 2023-12-26 DIAGNOSIS — D124 Benign neoplasm of descending colon: Secondary | ICD-10-CM | POA: Diagnosis not present

## 2024-01-28 DIAGNOSIS — D225 Melanocytic nevi of trunk: Secondary | ICD-10-CM | POA: Diagnosis not present

## 2024-01-28 DIAGNOSIS — Z85820 Personal history of malignant melanoma of skin: Secondary | ICD-10-CM | POA: Diagnosis not present

## 2024-01-28 DIAGNOSIS — Z08 Encounter for follow-up examination after completed treatment for malignant neoplasm: Secondary | ICD-10-CM | POA: Diagnosis not present

## 2024-01-28 DIAGNOSIS — L821 Other seborrheic keratosis: Secondary | ICD-10-CM | POA: Diagnosis not present

## 2024-01-28 DIAGNOSIS — Z1283 Encounter for screening for malignant neoplasm of skin: Secondary | ICD-10-CM | POA: Diagnosis not present

## 2024-02-05 DIAGNOSIS — H40023 Open angle with borderline findings, high risk, bilateral: Secondary | ICD-10-CM | POA: Diagnosis not present

## 2024-02-26 DIAGNOSIS — R809 Proteinuria, unspecified: Secondary | ICD-10-CM | POA: Diagnosis not present

## 2024-02-26 DIAGNOSIS — E162 Hypoglycemia, unspecified: Secondary | ICD-10-CM | POA: Diagnosis not present

## 2024-02-26 DIAGNOSIS — I10 Essential (primary) hypertension: Secondary | ICD-10-CM | POA: Diagnosis not present

## 2024-02-26 DIAGNOSIS — E782 Mixed hyperlipidemia: Secondary | ICD-10-CM | POA: Diagnosis not present

## 2024-04-06 ENCOUNTER — Ambulatory Visit
Admission: RE | Admit: 2024-04-06 | Discharge: 2024-04-06 | Disposition: A | Discharge status: Home / Self Care | Payer: BLUE CROSS/BLUE SHIELD | Source: Ambulatory Visit | Attending: Family Medicine | Admitting: Family Medicine

## 2024-04-06 DIAGNOSIS — S32000A Wedge compression fracture of unspecified lumbar vertebra, initial encounter for closed fracture: Secondary | ICD-10-CM

## 2024-04-06 DIAGNOSIS — M8588 Other specified disorders of bone density and structure, other site: Secondary | ICD-10-CM | POA: Diagnosis not present

## 2024-07-26 DIAGNOSIS — S2242XA Multiple fractures of ribs, left side, initial encounter for closed fracture: Secondary | ICD-10-CM | POA: Diagnosis not present

## 2024-07-28 DIAGNOSIS — Z85820 Personal history of malignant melanoma of skin: Secondary | ICD-10-CM | POA: Diagnosis not present

## 2024-07-28 DIAGNOSIS — D225 Melanocytic nevi of trunk: Secondary | ICD-10-CM | POA: Diagnosis not present

## 2024-07-28 DIAGNOSIS — Z08 Encounter for follow-up examination after completed treatment for malignant neoplasm: Secondary | ICD-10-CM | POA: Diagnosis not present

## 2024-07-28 DIAGNOSIS — B351 Tinea unguium: Secondary | ICD-10-CM | POA: Diagnosis not present

## 2024-07-28 DIAGNOSIS — Z1283 Encounter for screening for malignant neoplasm of skin: Secondary | ICD-10-CM | POA: Diagnosis not present

## 2024-07-29 DIAGNOSIS — H0100A Unspecified blepharitis right eye, upper and lower eyelids: Secondary | ICD-10-CM | POA: Diagnosis not present

## 2024-07-29 DIAGNOSIS — H2513 Age-related nuclear cataract, bilateral: Secondary | ICD-10-CM | POA: Diagnosis not present

## 2024-07-29 DIAGNOSIS — H43813 Vitreous degeneration, bilateral: Secondary | ICD-10-CM | POA: Diagnosis not present

## 2024-07-29 DIAGNOSIS — H0100B Unspecified blepharitis left eye, upper and lower eyelids: Secondary | ICD-10-CM | POA: Diagnosis not present

## 2024-07-29 DIAGNOSIS — H5211 Myopia, right eye: Secondary | ICD-10-CM | POA: Diagnosis not present

## 2024-07-29 DIAGNOSIS — H524 Presbyopia: Secondary | ICD-10-CM | POA: Diagnosis not present

## 2024-07-29 DIAGNOSIS — H401131 Primary open-angle glaucoma, bilateral, mild stage: Secondary | ICD-10-CM | POA: Diagnosis not present

## 2024-07-29 DIAGNOSIS — H25013 Cortical age-related cataract, bilateral: Secondary | ICD-10-CM | POA: Diagnosis not present

## 2024-08-09 DIAGNOSIS — Z23 Encounter for immunization: Secondary | ICD-10-CM | POA: Diagnosis not present

## 2024-08-23 DIAGNOSIS — R0789 Other chest pain: Secondary | ICD-10-CM | POA: Diagnosis not present

## 2024-08-24 DIAGNOSIS — I10 Essential (primary) hypertension: Secondary | ICD-10-CM | POA: Diagnosis not present

## 2024-08-24 DIAGNOSIS — Z23 Encounter for immunization: Secondary | ICD-10-CM | POA: Diagnosis not present

## 2024-08-24 DIAGNOSIS — E162 Hypoglycemia, unspecified: Secondary | ICD-10-CM | POA: Diagnosis not present

## 2024-08-24 DIAGNOSIS — R809 Proteinuria, unspecified: Secondary | ICD-10-CM | POA: Diagnosis not present

## 2024-08-24 DIAGNOSIS — N400 Enlarged prostate without lower urinary tract symptoms: Secondary | ICD-10-CM | POA: Diagnosis not present

## 2024-08-24 DIAGNOSIS — E782 Mixed hyperlipidemia: Secondary | ICD-10-CM | POA: Diagnosis not present

## 2024-09-01 DIAGNOSIS — M810 Age-related osteoporosis without current pathological fracture: Secondary | ICD-10-CM | POA: Diagnosis not present

## 2024-09-01 DIAGNOSIS — Z6828 Body mass index (BMI) 28.0-28.9, adult: Secondary | ICD-10-CM | POA: Diagnosis not present

## 2024-09-01 DIAGNOSIS — Z23 Encounter for immunization: Secondary | ICD-10-CM | POA: Diagnosis not present

## 2024-09-01 DIAGNOSIS — I10 Essential (primary) hypertension: Secondary | ICD-10-CM | POA: Diagnosis not present

## 2024-09-01 DIAGNOSIS — E782 Mixed hyperlipidemia: Secondary | ICD-10-CM | POA: Diagnosis not present

## 2024-09-01 DIAGNOSIS — Z0000 Encounter for general adult medical examination without abnormal findings: Secondary | ICD-10-CM | POA: Diagnosis not present

## 2024-10-19 DIAGNOSIS — K148 Other diseases of tongue: Secondary | ICD-10-CM | POA: Diagnosis not present
